# Patient Record
Sex: Female | Born: 1950
Health system: Southern US, Community
[De-identification: ages and names within clinical notes are randomized; demographics above are authoritative.]

## PROBLEM LIST (undated history)

## (undated) DIAGNOSIS — E079 Disorder of thyroid, unspecified: Secondary | ICD-10-CM

## (undated) HISTORY — PX: BACK SURGERY: SHX140

## (undated) HISTORY — DX: Disorder of thyroid, unspecified: E07.9

## (undated) HISTORY — PX: LAPAROSCOPIC BILATERAL SALPINGO OOPHERECTOMY: SHX5890

---

## 1998-01-02 ENCOUNTER — Emergency Department (HOSPITAL_COMMUNITY): Admission: EM | Admit: 1998-01-02 | Discharge: 1998-01-02 | Payer: Self-pay | Admitting: Emergency Medicine

## 1998-01-09 ENCOUNTER — Inpatient Hospital Stay (HOSPITAL_COMMUNITY): Admission: RE | Admit: 1998-01-09 | Discharge: 1998-01-10 | Payer: Self-pay | Admitting: Neurosurgery

## 1998-11-10 ENCOUNTER — Emergency Department (HOSPITAL_COMMUNITY): Admission: EM | Admit: 1998-11-10 | Discharge: 1998-11-10 | Payer: Self-pay | Admitting: Emergency Medicine

## 2002-07-09 ENCOUNTER — Emergency Department (HOSPITAL_COMMUNITY): Admission: EM | Admit: 2002-07-09 | Discharge: 2002-07-10 | Payer: Self-pay | Admitting: Emergency Medicine

## 2002-07-10 ENCOUNTER — Encounter: Payer: Self-pay | Admitting: Emergency Medicine

## 2006-07-25 ENCOUNTER — Ambulatory Visit: Payer: Self-pay

## 2007-05-02 ENCOUNTER — Ambulatory Visit: Payer: Self-pay

## 2009-08-19 ENCOUNTER — Encounter: Admission: RE | Admit: 2009-08-19 | Discharge: 2009-08-19 | Payer: Self-pay | Admitting: Endocrinology

## 2009-09-10 ENCOUNTER — Other Ambulatory Visit: Admission: RE | Admit: 2009-09-10 | Discharge: 2009-09-10 | Payer: Self-pay | Admitting: Interventional Radiology

## 2009-09-10 ENCOUNTER — Encounter: Admission: RE | Admit: 2009-09-10 | Discharge: 2009-09-10 | Payer: Self-pay | Admitting: Endocrinology

## 2010-08-11 ENCOUNTER — Other Ambulatory Visit: Payer: Self-pay | Admitting: Endocrinology

## 2010-08-11 DIAGNOSIS — E041 Nontoxic single thyroid nodule: Secondary | ICD-10-CM

## 2010-08-14 ENCOUNTER — Ambulatory Visit
Admission: RE | Admit: 2010-08-14 | Discharge: 2010-08-14 | Disposition: A | Discharge status: Home / Self Care | Payer: 59 | Source: Ambulatory Visit | Attending: Endocrinology | Admitting: Endocrinology

## 2010-08-14 DIAGNOSIS — E041 Nontoxic single thyroid nodule: Secondary | ICD-10-CM

## 2012-02-14 ENCOUNTER — Other Ambulatory Visit: Payer: Self-pay | Admitting: Otolaryngology

## 2012-02-14 DIAGNOSIS — R0982 Postnasal drip: Secondary | ICD-10-CM

## 2012-07-19 ENCOUNTER — Other Ambulatory Visit: Payer: Self-pay | Admitting: Endocrinology

## 2012-07-19 DIAGNOSIS — E049 Nontoxic goiter, unspecified: Secondary | ICD-10-CM

## 2012-07-28 ENCOUNTER — Ambulatory Visit
Admission: RE | Admit: 2012-07-28 | Discharge: 2012-07-28 | Disposition: A | Discharge status: Home / Self Care | Payer: 59 | Source: Ambulatory Visit | Attending: Endocrinology | Admitting: Endocrinology

## 2012-07-28 DIAGNOSIS — E049 Nontoxic goiter, unspecified: Secondary | ICD-10-CM

## 2013-10-25 ENCOUNTER — Other Ambulatory Visit: Payer: Self-pay | Admitting: Endocrinology

## 2013-10-25 DIAGNOSIS — E049 Nontoxic goiter, unspecified: Secondary | ICD-10-CM

## 2013-11-01 ENCOUNTER — Ambulatory Visit
Admission: RE | Admit: 2013-11-01 | Discharge: 2013-11-01 | Disposition: A | Discharge status: Home / Self Care | Payer: 59 | Source: Ambulatory Visit | Attending: Endocrinology | Admitting: Endocrinology

## 2013-11-01 DIAGNOSIS — E049 Nontoxic goiter, unspecified: Secondary | ICD-10-CM

## 2014-06-28 ENCOUNTER — Other Ambulatory Visit: Payer: Self-pay | Admitting: Obstetrics and Gynecology

## 2014-07-04 LAB — CYTOLOGY - PAP

## 2014-08-02 ENCOUNTER — Other Ambulatory Visit: Payer: Self-pay | Admitting: Obstetrics and Gynecology

## 2014-08-06 LAB — CYTOLOGY - PAP

## 2016-05-05 ENCOUNTER — Other Ambulatory Visit: Payer: Self-pay | Admitting: Obstetrics and Gynecology

## 2016-05-05 DIAGNOSIS — N644 Mastodynia: Secondary | ICD-10-CM

## 2016-05-11 ENCOUNTER — Other Ambulatory Visit: Payer: Self-pay

## 2016-05-12 ENCOUNTER — Ambulatory Visit
Admission: RE | Admit: 2016-05-12 | Discharge: 2016-05-12 | Disposition: A | Discharge status: Home / Self Care | Payer: Commercial Managed Care - HMO | Source: Ambulatory Visit | Attending: Obstetrics and Gynecology | Admitting: Obstetrics and Gynecology

## 2016-05-12 ENCOUNTER — Other Ambulatory Visit: Payer: Self-pay

## 2016-05-12 DIAGNOSIS — N644 Mastodynia: Secondary | ICD-10-CM

## 2016-11-29 ENCOUNTER — Telehealth: Payer: Self-pay | Admitting: *Deleted

## 2016-11-29 ENCOUNTER — Encounter: Payer: Self-pay | Admitting: *Deleted

## 2016-11-29 NOTE — Telephone Encounter (Signed)
Referral notes sent to scheduling

## 2017-02-02 ENCOUNTER — Other Ambulatory Visit: Payer: Self-pay | Admitting: Endocrinology

## 2017-02-02 DIAGNOSIS — E049 Nontoxic goiter, unspecified: Secondary | ICD-10-CM

## 2017-02-28 ENCOUNTER — Ambulatory Visit
Admission: RE | Admit: 2017-02-28 | Discharge: 2017-02-28 | Disposition: A | Discharge status: Home / Self Care | Payer: 59 | Source: Ambulatory Visit | Attending: Endocrinology | Admitting: Endocrinology

## 2017-02-28 DIAGNOSIS — E049 Nontoxic goiter, unspecified: Secondary | ICD-10-CM

## 2017-10-21 ENCOUNTER — Ambulatory Visit: Payer: 59 | Admitting: Cardiology

## 2017-10-25 NOTE — Progress Notes (Signed)
Cardiology Office Note:    Date:  10/26/2017   ID:  Monica Camacho, DOB [birth year], MRN [id number]  PCP:  Leighton Ruff, MD  Cardiologist:  Buford Dresser, MD PhD  Referring MD: Leighton Ruff, MD   Chief Complaint  Patient presents with  . Follow-up  . Chest Pain    Atypical.  . Shortness of Breath    Going up stairs.  . Headache   History of Present Illness:    Monica Camacho is a 67 y.o. female with a hx of hypothyroidism who is seen as a new consult at the request of Leighton Ruff, MD for the evaluation and management of chest pain, family history of CAD.  Per records received from Dr. Drema Dallas' office: having a "sting" in her left breast, occurs randomly, burning/stinging sensation, lasts less than a minute. No associated nausea, vomiting, or shortness of breath. She is physically active, and pain does not occur with activity. ECG in the office is reported as sinus rhythm, 53 bpm.   Patient concerns: chest pain, risk based on her history  Chest pain:  -Initial onset: about a year ago -Quality: burning, stinging -Frequency: weekly -Duration: very brief usually, longest ever was maybe an hour -Triggers: stress, not related to exertion -Aggravating/alleviating factors: better with aspirin. Not related to exertion, time of day, food, or position. Not pleuritic, no tenderness -Prior cardiac history: no personal -Prior ECG: normal sinus rhythm -Prior workup: none -Prior treatment: none -Alcohol: none -Tobacco: former smoker, smoked for about 5-6 years, quit in 1978 or so -Comorbidities: hypothyroidism, hyperlipidemia -Exercise level: walks all day for her job, works around the house and in her yard -Labs: TSH normal. -Cardiac ROS: no shortness of breath, no PND, no orthopnea, no LE edema, no syncope  Family history: brother died of MI at age 39, had HTN and HLD; younger sister has stents; father had CVAs, died at age 29; mother had heart failure for 15  years and a history of angina, passed away at age 56.Marland Kitchen  Social history: Prior smoker, no alcohol. Divorced, has two children. Raising her granddaughter. Stays active and also walks as a part of her job. Diet is poor.  Past Medical History:  Diagnosis Date  . Thyroid disease     Past Surgical History:  Procedure Laterality Date  . BACK SURGERY    . CESAREAN SECTION    . LAPAROSCOPIC BILATERAL SALPINGO OOPHERECTOMY      Current Medications: Current Outpatient Medications on File Prior to Visit  Medication Sig  . levothyroxine (SYNTHROID, LEVOTHROID) 112 MCG tablet Take 112 mcg by mouth daily.  . Vitamin D, Ergocalciferol, 2000 units CAPS Take 2,000 Units by mouth daily.   No current facility-administered medications on file prior to visit.      Allergies:   Penicillins   Social History   Socioeconomic History  . Marital status: Single    Spouse name: Not on file  . Number of children: Not on file  . Years of education: Not on file  . Highest education level: Not on file  Occupational History  . Not on file  Social Needs  . Financial resource strain: Not on file  . Food insecurity:    Worry: Not on file    Inability: Not on file  . Transportation needs:    Medical: Not on file    Non-medical: Not on file  Tobacco Use  . Smoking status: Former Research scientist (life sciences)  . Smokeless tobacco: Never Used  Substance and Sexual Activity  .  Alcohol use: No  . Drug use: No  . Sexual activity: Not on file  Lifestyle  . Physical activity:    Days per week: Not on file    Minutes per session: Not on file  . Stress: Not on file  Relationships  . Social connections:    Talks on phone: Not on file    Gets together: Not on file    Attends religious service: Not on file    Active member of club or organization: Not on file    Attends meetings of clubs or organizations: Not on file    Relationship status: Not on file  Other Topics Concern  . Not on file  Social History Narrative  . Not on  file     Family History: The patient's family history includes Heart attack (age of onset: 54) in her brother; Heart disease in her father, mother, and sister; Heart failure in her mother; Stroke in her father.  ROS:   Please see the history of present illness.  Additional pertinent ROS: Review of Systems  Constitutional: Negative for chills and fever.  HENT: Negative for ear pain and hearing loss.   Eyes: Negative for photophobia and pain.  Respiratory: Positive for shortness of breath. Negative for sputum production.   Cardiovascular: Positive for chest pain. Negative for palpitations, orthopnea, claudication, leg swelling and PND.  Gastrointestinal: Negative for abdominal pain, blood in stool and melena.  Genitourinary: Negative for dysuria and hematuria.  Musculoskeletal: Negative for falls and myalgias.  Skin: Negative for itching and rash.  Neurological: Negative for sensory change, loss of consciousness and weakness.  Endo/Heme/Allergies: Does not bruise/bleed easily.   EKGs/Labs/Other Studies Reviewed:    The following studies were reviewed today: Prior notes and labs  EKG:  EKG is ordered today.  The ekg ordered today demonstrates normal sinus rhythm  Recent Labs: No results found for requested labs within last 8760 hours.  Recent Lipid Panel No results found for: CHOL, TRIG, HDL, CHOLHDL, VLDL, LDLCALC, LDLDIRECT  Lipids 11/29/2016 Tchol 205, LDL 142, HDL 50 TSH 0.658 No A1c  Physical Exam:    VS:  BP 108/72 (BP Location: Left Arm, Patient Position: Sitting, Cuff Size: Normal)   Pulse 72   Ht 5\' 2"  (1.575 m)   Wt 141 lb (64 kg)   BMI 25.79 kg/m     Wt Readings from Last 3 Encounters:  10/26/17 141 lb (64 kg)     GEN: Well nourished, well developed in no acute distress HEENT: Normal NECK: No JVD; No carotid bruits LYMPHATICS: No lymphadenopathy CARDIAC: regular rhythm, normal S1 and S2, no murmurs, rubs, gallops. Radial and DP pulses 2+  bilaterally. RESPIRATORY:  Clear to auscultation without rales, wheezing or rhonchi  ABDOMEN: Soft, non-tender, non-distended MUSCULOSKELETAL:  No edema; No deformity  SKIN: Warm and dry NEUROLOGIC:  Alert and oriented x 3 PSYCHIATRIC:  Normal affect   ASSESSMENT:    1. Chest pain, unspecified type   2. Pre-procedure lab exam   3. Counseling on health promotion and disease prevention   4. Family history of heart disease    PLAN:    1. Chest pain: symptoms atypical, but has been very concerning for her. She has a strong family history, which is not accounted for in her ASCVD risk score. It would be beneficial to know her anatomy, both to determine if CAD is the cause of her pain, and a coronary calcium score/plaque quantification would be helpful for risk stratification and management  -  CT coronary angiography, with BMP prior to test  -will also order future lipid panel (to be drawn at the same time) to update her risk assessment  2. Primary prevention 10 year ASCDV risk is 4.2%. However, her family history is not part of this. Based on results of testing, would discuss further if she would benefit from a statin  Prevention: -recommend heart healthy/Mediterranean diet, with whole grains, fruits, vegetable, fish, lean meats, nuts, and olive oil. Limit salt. -recommend moderate walking, 3-5 times/week for 30-50 minutes each session. Aim for at least 150 minutes.week. Goal should be pace of 3 miles/hours, or walking 1.5 miles in 30 minutes -recommend avoidance of tobacco products. Avoid excess alcohol. -Additional risk factor control:  -Diabetes: A1c is not in the system  -Lipids: updating as above  -Blood pressure control: well controlled  -Weight: BMI 25  Plan for follow up: 3 mos to discuss risk assessment and next steps  Medication Adjustments/Labs and Tests Ordered: Current medicines are reviewed at length with the patient today.  Concerns regarding medicines are outlined above.   Orders Placed This Encounter  Procedures  . CT CORONARY MORPH W/CTA COR W/SCORE W/CA W/CM &/OR WO/CM  . CT CORONARY FRACTIONAL FLOW RESERVE DATA PREP  . CT CORONARY FRACTIONAL FLOW RESERVE FLUID ANALYSIS  . Basic metabolic panel  . EKG 12-Lead   Meds ordered this encounter  Medications  . metoprolol tartrate (LOPRESSOR) 100 MG tablet    Sig: TAKE 1 TABLET 2 HOURS PRIOR TO TEST    Dispense:  1 tablet    Refill:  0    Patient Instructions  Medication Instructions:  TAKE METOPROLOL 100 MG 1 TABLET 2 HOURS PRIOR TO TEST   Labwork: BMET 1 WEEK PRIOR TO CT SCAN   Testing/Procedures: Your physician has requested that you have cardiac CT. Cardiac computed tomography (CT) is a painless test that uses an x-ray machine to take clear, detailed pictures of your heart. For further information please visit [email or web address]. Please follow instruction sheet as given. THE OFFICE WILL CALL TO SCHEDULE ONCE THIS HAS BEEN APPROVED BY YOUR INSURANCE   Follow-Up: Your physician recommends that you schedule a follow-up appointment in: 3 MONTHS   Please arrive at the [facility] main entrance of [facility] at xx:xx AM (30-45 minutes prior to test start time)  [facility] [place], [place] [id number] [phone]  Proceed to the [facility] Radiology Department (First Floor).  Please follow these instructions carefully (unless otherwise directed):  On the Night Before the Test: . Be sure to Drink plenty of water. . Do not consume any caffeinated/decaffeinated beverages or chocolate 12 hours prior to your test. . Do not take any antihistamines 12 hours prior to your test. . If you take Metformin do not take 24 hours prior to test. . If the patient has contrast allergy: ? Patient will need a prescription for Prednisone and very clear instructions (as follows): 1. Prednisone 50 mg - take 13 hours prior to test 2. Take another Prednisone 50 mg 7 hours prior  to test 3. Take another Prednisone 50 mg 1 hour prior to test 4. Take Benadryl 50 mg 1 hour prior to test . Patient must complete all four doses of above prophylactic medications. . Patient will need a ride after test due to Benadryl.  On the Day of the Test: . Drink plenty of water. Do not drink any water within one hour of the test. . Do not eat  any food 4 hours prior to the test. . You may take your regular medications prior to the test. . IF NOT ON BETA BLOCKER-Take 100 mg of Lopressor (metoprolol) TWO hours before test . HOLD Furosemide morning of the test.  After the Test: . Drink plenty of water. . After receiving IV contrast, you may experience a mild flushed feeling. This is normal. . On occasion, you may experience a mild rash up to 24 hours after the test. This is not dangerous. If this occurs, you can take Benadryl 25 mg and increase your fluid intake. . If you experience trouble breathing, this can be serious. If it is severe call 911 IMMEDIATELY. If it is mild, please call our office. . If you take any of these medications: Glipizide/Metformin, Avandament, Glucavance, please do not take 48 hours after completing test     Cardiac CT Angiogram A cardiac CT angiogram is a procedure to look at the heart and the area around the heart. It may be done to help find the cause of chest pains or other symptoms of heart disease. During this procedure, a large X-ray machine, called a CT scanner, takes detailed pictures of the heart and the surrounding area after a dye (contrast material) has been injected into blood vessels in the area. The procedure is also sometimes called a coronary CT angiogram, coronary artery scanning, or CTA. A cardiac CT angiogram allows the health care provider to see how well blood is flowing to and from the heart. The health care provider will be able to see if there are any problems, such as:  Blockage or narrowing of the coronary arteries in the  heart.  Fluid around the heart.  Signs of weakness or disease in the muscles, valves, and tissues of the heart.  Tell a health care provider about:  Any allergies you have. This is especially important if you have had a previous allergic reaction to contrast dye.  All medicines you are taking, including vitamins, herbs, eye drops, creams, and over-the-counter medicines.  Any blood disorders you have.  Any surgeries you have had.  Any medical conditions you have.  Whether you are pregnant or may be pregnant.  Any anxiety disorders, chronic pain, or other conditions you have that may increase your stress or prevent you from lying still. What are the risks? Generally, this is a safe procedure. However, problems may occur, including:  Bleeding.  Infection.  Allergic reactions to medicines or dyes.  Damage to other structures or organs.  Kidney damage from the dye or contrast that is used.  Increased risk of cancer from radiation exposure. This risk is low. Talk with your health care provider about: ? The risks and benefits of testing. ? How you can receive the lowest dose of radiation.  What happens before the procedure?  Wear comfortable clothing and remove any jewelry, glasses, dentures, and hearing aids.  Follow instructions from your health care provider about eating and drinking. This may include: ? For 12 hours before the test - avoid caffeine. This includes tea, coffee, soda, energy drinks, and diet pills. Drink plenty of water or other fluids that do not have caffeine in them. Being well-hydrated can prevent complications. ? For 4-6 hours before the test - stop eating and drinking. The contrast dye can cause nausea, but this is less likely if your stomach is empty.  Ask your health care provider about changing or stopping your regular medicines. This is especially important if you are taking diabetes  medicines, blood thinners, or medicines to treat erectile  dysfunction. What happens during the procedure?  Hair on your chest may need to be removed so that small sticky patches called electrodes can be placed on your chest. These will transmit information that helps to monitor your heart during the test.  An IV tube will be inserted into one of your veins.  You might be given a medicine to control your heart rate during the test. This will help to ensure that good images are obtained.  You will be asked to lie on an exam table. This table will slide in and out of the CT machine during the procedure.  Contrast dye will be injected into the IV tube. You might feel warm, or you may get a metallic taste in your mouth.  You will be given a medicine (nitroglycerin) to relax (dilate) the arteries in your heart.  The table that you are lying on will move into the CT machine tunnel for the scan.  The person running the machine will give you instructions while the scans are being done. You may be asked to: ? Keep your arms above your head. ? Hold your breath. ? Stay very still, even if the table is moving.  When the scanning is complete, you will be moved out of the machine.  The IV tube will be removed. The procedure may vary among health care providers and hospitals. What happens after the procedure?  You might feel warm, or you may get a metallic taste in your mouth from the contrast dye.  You may have a headache from the nitroglycerin.  After the procedure, drink water or other fluids to wash (flush) the contrast material out of your body.  Contact a health care provider if you have any symptoms of allergy to the contrast. These symptoms include: ? Shortness of breath. ? Rash or hives. ? A racing heartbeat.  Most people can return to their normal activities right after the procedure. Ask your health care provider what activities are safe for you.  It is up to you to get the results of your procedure. Ask your health care provider, or the  department that is doing the procedure, when your results will be ready. Summary  A cardiac CT angiogram is a procedure to look at the heart and the area around the heart. It may be done to help find the cause of chest pains or other symptoms of heart disease.  During this procedure, a large X-ray machine, called a CT scanner, takes detailed pictures of the heart and the surrounding area after a dye (contrast material) has been injected into blood vessels in the area.  Ask your health care provider about changing or stopping your regular medicines before the procedure. This is especially important if you are taking diabetes medicines, blood thinners, or medicines to treat erectile dysfunction.  After the procedure, drink water or other fluids to wash (flush) the contrast material out of your body. This information is not intended to replace advice given to you by your health care provider. Make sure you discuss any questions you have with your health care provider. Document Released: 01/01/2008 Document Revised: 12/08/2015 Document Reviewed: 12/08/2015 Elsevier Interactive Patient Education  2017 [employer]      Signed, Buford Dresser, MD PhD 10/26/2017 1:11 PM    [place]

## 2017-10-26 ENCOUNTER — Ambulatory Visit: Payer: 59 | Admitting: Cardiology

## 2017-10-26 ENCOUNTER — Encounter: Payer: Self-pay | Admitting: Cardiology

## 2017-10-26 VITALS — BP 108/72 | HR 72 | Ht 62.0 in | Wt 141.0 lb

## 2017-10-26 DIAGNOSIS — Z7189 Other specified counseling: Secondary | ICD-10-CM

## 2017-10-26 DIAGNOSIS — R079 Chest pain, unspecified: Secondary | ICD-10-CM | POA: Diagnosis not present

## 2017-10-26 DIAGNOSIS — Z01812 Encounter for preprocedural laboratory examination: Secondary | ICD-10-CM

## 2017-10-26 DIAGNOSIS — Z8249 Family history of ischemic heart disease and other diseases of the circulatory system: Secondary | ICD-10-CM | POA: Diagnosis not present

## 2017-10-26 MED ORDER — METOPROLOL TARTRATE 100 MG PO TABS: ORAL_TABLET | ORAL | 0 refills | Status: DC

## 2017-10-26 NOTE — Patient Instructions (Addendum)
Medication Instructions:  TAKE METOPROLOL 100 MG 1 TABLET 2 HOURS PRIOR TO TEST   Labwork: BMET 1 WEEK PRIOR TO CT SCAN   Testing/Procedures: Your physician has requested that you have cardiac CT. Cardiac computed tomography (CT) is a painless test that uses an x-ray machine to take clear, detailed pictures of your heart. For further information please visit [email or web address]. Please follow instruction sheet as given. THE OFFICE WILL CALL TO SCHEDULE ONCE THIS HAS BEEN APPROVED BY YOUR INSURANCE   Follow-Up: Your physician recommends that you schedule a follow-up appointment in: 3 MONTHS   Please arrive at the [facility] main entrance of [facility] at xx:xx AM (30-45 minutes prior to test start time)  [facility] [place], [place] [phone]  Proceed to the [facility] Radiology Department (First Floor).  Please follow these instructions carefully (unless otherwise directed):  On the Night Before the Test: . Be sure to Drink plenty of water. . Do not consume any caffeinated/decaffeinated beverages or chocolate 12 hours prior to your test. . Do not take any antihistamines 12 hours prior to your test. . If you take Metformin do not take 24 hours prior to test. . If the patient has contrast allergy: ? Patient will need a prescription for Prednisone and very clear instructions (as follows): 1. Prednisone 50 mg - take 13 hours prior to test 2. Take another Prednisone 50 mg 7 hours prior to test 3. Take another Prednisone 50 mg 1 hour prior to test 4. Take Benadryl 50 mg 1 hour prior to test . Patient must complete all four doses of above prophylactic medications. . Patient will need a ride after test due to Benadryl.  On the Day of the Test: . Drink plenty of water. Do not drink any water within one hour of the test. . Do not eat any food 4 hours prior to the test. . You may take your regular medications prior to the test. . IF  NOT ON BETA BLOCKER-Take 100 mg of Lopressor (metoprolol) TWO hours before test . HOLD Furosemide morning of the test.  After the Test: . Drink plenty of water. . After receiving IV contrast, you may experience a mild flushed feeling. This is normal. . On occasion, you may experience a mild rash up to 24 hours after the test. This is not dangerous. If this occurs, you can take Benadryl 25 mg and increase your fluid intake. . If you experience trouble breathing, this can be serious. If it is severe call 911 IMMEDIATELY. If it is mild, please call our office. . If you take any of these medications: Glipizide/Metformin, Avandament, Glucavance, please do not take 48 hours after completing test     Cardiac CT Angiogram A cardiac CT angiogram is a procedure to look at the heart and the area around the heart. It may be done to help find the cause of chest pains or other symptoms of heart disease. During this procedure, a large X-ray machine, called a CT scanner, takes detailed pictures of the heart and the surrounding area after a dye (contrast material) has been injected into blood vessels in the area. The procedure is also sometimes called a coronary CT angiogram, coronary artery scanning, or CTA. A cardiac CT angiogram allows the health care provider to see how well blood is flowing to and from the heart. The health care provider will be able to see if there are any problems, such as:  Blockage or  narrowing of the coronary arteries in the heart.  Fluid around the heart.  Signs of weakness or disease in the muscles, valves, and tissues of the heart.  Tell a health care provider about:  Any allergies you have. This is especially important if you have had a previous allergic reaction to contrast dye.  All medicines you are taking, including vitamins, herbs, eye drops, creams, and over-the-counter medicines.  Any blood disorders you have.  Any surgeries you have had.  Any medical conditions  you have.  Whether you are pregnant or may be pregnant.  Any anxiety disorders, chronic pain, or other conditions you have that may increase your stress or prevent you from lying still. What are the risks? Generally, this is a safe procedure. However, problems may occur, including:  Bleeding.  Infection.  Allergic reactions to medicines or dyes.  Damage to other structures or organs.  Kidney damage from the dye or contrast that is used.  Increased risk of cancer from radiation exposure. This risk is low. Talk with your health care provider about: ? The risks and benefits of testing. ? How you can receive the lowest dose of radiation.  What happens before the procedure?  Wear comfortable clothing and remove any jewelry, glasses, dentures, and hearing aids.  Follow instructions from your health care provider about eating and drinking. This may include: ? For 12 hours before the test - avoid caffeine. This includes tea, coffee, soda, energy drinks, and diet pills. Drink plenty of water or other fluids that do not have caffeine in them. Being well-hydrated can prevent complications. ? For 4-6 hours before the test - stop eating and drinking. The contrast dye can cause nausea, but this is less likely if your stomach is empty.  Ask your health care provider about changing or stopping your regular medicines. This is especially important if you are taking diabetes medicines, blood thinners, or medicines to treat erectile dysfunction. What happens during the procedure?  Hair on your chest may need to be removed so that small sticky patches called electrodes can be placed on your chest. These will transmit information that helps to monitor your heart during the test.  An IV tube will be inserted into one of your veins.  You might be given a medicine to control your heart rate during the test. This will help to ensure that good images are obtained.  You will be asked to lie on an exam  table. This table will slide in and out of the CT machine during the procedure.  Contrast dye will be injected into the IV tube. You might feel warm, or you may get a metallic taste in your mouth.  You will be given a medicine (nitroglycerin) to relax (dilate) the arteries in your heart.  The table that you are lying on will move into the CT machine tunnel for the scan.  The person running the machine will give you instructions while the scans are being done. You may be asked to: ? Keep your arms above your head. ? Hold your breath. ? Stay very still, even if the table is moving.  When the scanning is complete, you will be moved out of the machine.  The IV tube will be removed. The procedure may vary among health care providers and hospitals. What happens after the procedure?  You might feel warm, or you may get a metallic taste in your mouth from the contrast dye.  You may have a headache from the nitroglycerin.  After the procedure, drink water or other fluids to wash (flush) the contrast material out of your body.  Contact a health care provider if you have any symptoms of allergy to the contrast. These symptoms include: ? Shortness of breath. ? Rash or hives. ? A racing heartbeat.  Most people can return to their normal activities right after the procedure. Ask your health care provider what activities are safe for you.  It is up to you to get the results of your procedure. Ask your health care provider, or the department that is doing the procedure, when your results will be ready. Summary  A cardiac CT angiogram is a procedure to look at the heart and the area around the heart. It may be done to help find the cause of chest pains or other symptoms of heart disease.  During this procedure, a large X-ray machine, called a CT scanner, takes detailed pictures of the heart and the surrounding area after a dye (contrast material) has been injected into blood vessels in the  area.  Ask your health care provider about changing or stopping your regular medicines before the procedure. This is especially important if you are taking diabetes medicines, blood thinners, or medicines to treat erectile dysfunction.  After the procedure, drink water or other fluids to wash (flush) the contrast material out of your body. This information is not intended to replace advice given to you by your health care provider. Make sure you discuss any questions you have with your health care provider. Document Released: 01/01/2008 Document Revised: 12/08/2015 Document Reviewed: 12/08/2015 Elsevier Interactive Patient Education  2017 [employer].

## 2017-12-23 LAB — BASIC METABOLIC PANEL
BUN / CREAT RATIO: 14 (ref 12–28)
BUN: 16 mg/dL (ref 8–27)
CO2: 22 mmol/L (ref 20–29)
CREATININE: 1.14 mg/dL — AB (ref 0.57–1.00)
Calcium: 9.1 mg/dL (ref 8.7–10.3)
Chloride: 107 mmol/L — ABNORMAL HIGH (ref 96–106)
GFR calc Af Amer: 58 mL/min/{1.73_m2} — ABNORMAL LOW (ref >59)
GFR, EST NON AFRICAN AMERICAN: 51 mL/min/{1.73_m2} — AB (ref >59)
GLUCOSE: 81 mg/dL (ref 65–99)
Potassium: 4.4 mmol/L (ref 3.5–5.2)
SODIUM: 144 mmol/L (ref 134–144)

## 2017-12-30 ENCOUNTER — Ambulatory Visit (HOSPITAL_COMMUNITY)
Admission: RE | Admit: 2017-12-30 | Discharge: 2017-12-30 | Disposition: A | Discharge status: Home / Self Care | Payer: 59 | Source: Ambulatory Visit | Attending: Cardiology | Admitting: Cardiology

## 2017-12-30 DIAGNOSIS — R079 Chest pain, unspecified: Secondary | ICD-10-CM | POA: Insufficient documentation

## 2017-12-30 MED ORDER — NITROGLYCERIN 0.4 MG SL SUBL
0.8000 mg | SUBLINGUAL_TABLET | Freq: Once | SUBLINGUAL | Status: AC
  Administered 2017-12-30: 0.8 mg via SUBLINGUAL
  Filled 2017-12-30: qty 25

## 2017-12-30 MED ORDER — NITROGLYCERIN 0.4 MG SL SUBL
SUBLINGUAL_TABLET | SUBLINGUAL | Status: AC
  Filled 2017-12-30: qty 2

## 2017-12-30 MED ORDER — IOPAMIDOL (ISOVUE-370) INJECTION 76%
100.0000 mL | Freq: Once | INTRAVENOUS | Status: AC | PRN
  Administered 2017-12-30: 80 mL via INTRAVENOUS

## 2018-01-04 ENCOUNTER — Other Ambulatory Visit: Payer: Self-pay

## 2018-01-04 DIAGNOSIS — Z79899 Other long term (current) drug therapy: Secondary | ICD-10-CM

## 2018-01-09 ENCOUNTER — Other Ambulatory Visit: Payer: Self-pay | Admitting: *Deleted

## 2018-01-09 DIAGNOSIS — Z79899 Other long term (current) drug therapy: Secondary | ICD-10-CM

## 2018-01-09 DIAGNOSIS — R079 Chest pain, unspecified: Secondary | ICD-10-CM

## 2018-01-09 DIAGNOSIS — Z8249 Family history of ischemic heart disease and other diseases of the circulatory system: Secondary | ICD-10-CM

## 2018-01-09 LAB — LIPID PANEL
CHOLESTEROL TOTAL: 203 mg/dL — AB (ref 100–199)
Chol/HDL Ratio: 3.6 ratio (ref 0.0–4.4)
HDL: 56 mg/dL (ref >39)
LDL CALC: 133 mg/dL — AB (ref 0–99)
Triglycerides: 70 mg/dL (ref 0–149)
VLDL CHOLESTEROL CAL: 14 mg/dL (ref 5–40)

## 2018-01-09 LAB — HEPATIC FUNCTION PANEL
ALT: 14 IU/L (ref 0–32)
AST: 18 IU/L (ref 0–40)
Albumin: 4.4 g/dL (ref 3.6–4.8)
Alkaline Phosphatase: 65 IU/L (ref 39–117)
BILIRUBIN TOTAL: 0.3 mg/dL (ref 0.0–1.2)
BILIRUBIN, DIRECT: 0.07 mg/dL (ref 0.00–0.40)
TOTAL PROTEIN: 6 g/dL (ref 6.0–8.5)

## 2018-01-12 ENCOUNTER — Ambulatory Visit: Payer: 59 | Admitting: Cardiology

## 2018-02-07 ENCOUNTER — Ambulatory Visit: Payer: 59 | Admitting: Cardiology

## 2018-02-07 ENCOUNTER — Encounter: Payer: Self-pay | Admitting: Cardiology

## 2018-02-07 VITALS — BP 132/74 | HR 69 | Ht 61.0 in | Wt 142.0 lb

## 2018-02-07 DIAGNOSIS — Z7189 Other specified counseling: Secondary | ICD-10-CM | POA: Diagnosis not present

## 2018-02-07 DIAGNOSIS — I2583 Coronary atherosclerosis due to lipid rich plaque: Secondary | ICD-10-CM

## 2018-02-07 DIAGNOSIS — I2510 Atherosclerotic heart disease of native coronary artery without angina pectoris: Secondary | ICD-10-CM | POA: Diagnosis not present

## 2018-02-07 DIAGNOSIS — Z8249 Family history of ischemic heart disease and other diseases of the circulatory system: Secondary | ICD-10-CM | POA: Diagnosis not present

## 2018-02-07 DIAGNOSIS — Z79899 Other long term (current) drug therapy: Secondary | ICD-10-CM | POA: Diagnosis not present

## 2018-02-07 DIAGNOSIS — R072 Precordial pain: Secondary | ICD-10-CM

## 2018-02-07 DIAGNOSIS — Z713 Dietary counseling and surveillance: Secondary | ICD-10-CM

## 2018-02-07 MED ORDER — ROSUVASTATIN CALCIUM 20 MG PO TABS: 20.0000 mg | ORAL_TABLET | Freq: Every day | ORAL | 3 refills | Status: DC

## 2018-02-07 NOTE — Progress Notes (Signed)
Cardiology Office Note:    Date:  02/07/2018   ID:  Monica Camacho, DOB 1950/11/30, MRN [id number]  PCP:  Leighton Ruff, MD  Cardiologist:  Buford Dresser, MD PhD  Referring MD: Leighton Ruff, MD   CC: Follow up of test results  History of Present Illness:    Monica Camacho is a 68 y.o. female with a hx of hypothyroidism who is seen in follow up for the evaluation and management of chest pain, family history of CAD. Initial consult was on 10/26/17.  Cardiac history: initially presented with a "sting" in her left breast, occurs randomly, burning/stinging sensation, lasts less than a minute. No associated nausea, vomiting, or shortness of breath. Nonexertional. She is physically active, and pain does not occur with activity. ECG in the office is reported as sinus rhythm, 53 bpm. She does however have a significant family history of CAD: brother died of MI at age 30, had HTN and HLD; younger sister has stents; father had CVAs, died at age 15; mother had heart failure for 15 years and a history of angina, passed away at age 28.  Today: spent time reviewing the results of her coronary CTA, including drawing out anatomy, reviewing recommendations for management. FFR was negative, so no indication for cath, and her chest pain is atypical. However, her risk is elevated, and we reviewed aggressive secondary prevention, see below.  Past Medical History:  Diagnosis Date  . Thyroid disease     Past Surgical History:  Procedure Laterality Date  . BACK SURGERY    . CESAREAN SECTION    . LAPAROSCOPIC BILATERAL SALPINGO OOPHERECTOMY      Current Medications: Current Outpatient Medications on File Prior to Visit  Medication Sig  . levothyroxine (SYNTHROID, LEVOTHROID) 112 MCG tablet Take 112 mcg by mouth daily.  . Vitamin D, Ergocalciferol, 2000 units CAPS Take 2,000 Units by mouth daily.   No current facility-administered medications on file prior to visit.      Allergies:    Penicillins   Social History   Socioeconomic History  . Marital status: Single    Spouse name: Not on file  . Number of children: Not on file  . Years of education: Not on file  . Highest education level: Not on file  Occupational History  . Not on file  Social Needs  . Financial resource strain: Not on file  . Food insecurity:    Worry: Not on file    Inability: Not on file  . Transportation needs:    Medical: Not on file    Non-medical: Not on file  Tobacco Use  . Smoking status: Former Research scientist (life sciences)  . Smokeless tobacco: Never Used  Substance and Sexual Activity  . Alcohol use: No  . Drug use: No  . Sexual activity: Not on file  Lifestyle  . Physical activity:    Days per week: Not on file    Minutes per session: Not on file  . Stress: Not on file  Relationships  . Social connections:    Talks on phone: Not on file    Gets together: Not on file    Attends religious service: Not on file    Active member of club or organization: Not on file    Attends meetings of clubs or organizations: Not on file    Relationship status: Not on file  Other Topics Concern  . Not on file  Social History Narrative  . Not on file   Social history: Prior smoker,  no alcohol. Divorced, has two children. Raising her granddaughter. Stays active and also walks as a part of her job. Diet is poor.  Family History: The patient's family history includes Heart attack (age of onset: 33) in her brother; Heart disease in her father, mother, and sister; Heart failure in her mother; Stroke in her father.  as sinus rhythm, 53 bpm. She does however have a significant family history of CAD, y history: brother died of MI at age 32, had HTN and HLD; younger sister has stents; father had CVAs, died at age 31; mother had heart failure for 15 years and a history of angina, passed away at age 76  ROS:   Please see the history of present illness.  Additional pertinent ROS: Review of Systems  Constitutional: Negative  for chills and fever.  HENT: Negative for ear pain and hearing loss.   Eyes: Negative for photophobia and pain.  Respiratory: Negative for sputum production and shortness of breath.   Cardiovascular: Positive for chest pain. Negative for palpitations, orthopnea, claudication, leg swelling and PND.  Gastrointestinal: Negative for abdominal pain, blood in stool and melena.  Genitourinary: Negative for dysuria and hematuria.  Musculoskeletal: Negative for falls and myalgias.  Skin: Negative for itching and rash.  Neurological: Negative for sensory change, loss of consciousness and weakness.  Endo/Heme/Allergies: Does not bruise/bleed easily.   EKGs/Labs/Other Studies Reviewed:    The following studies were reviewed today: Coronary CTA with FFR 12/31/17  Coronary Arteries:  Normal coronary origin.  Right dominance.  RCA is a large dominant artery that gives rise to PDA and PLVB. There is minimal plaque.  Left main is a large artery that gives rise to LAD a very small ramus intermedius and LCX arteries. Left main has minimal plaque in the distal portion with stenosis < 0-25%.  LAD is a large vessel that gives rise to a large diagonal artery. Proximal LAD has a long segment of mixed plaque with a focal stenosis 50-69%. Mid to distal LAD has minimal stenosis.  D1 is a large artery that has moderate mixed plaque in the proximal segment with a focal stenosis of 50-69%.  LCX is a non-dominant artery that gives rise to one medium sized OM1 branch. There is mild diffuse plaque.  IMPRESSION: 1. Coronary calcium score of 153. This was 58 percentile for age and sex matched control.  2. Normal coronary origin with right dominance.  3. There is moderate plaque in the proximal LAD and proximal to mid portion of a large 1. diagonal artery  FFR: 1. Left Main:  No significant stenosis. 2. LAD: No significant stenosis. 3. LCX: No significant stenosis. 4. RCA: No significant  stenosis.   EKG:  EKG is personally reviewed today.  The ekg ordered previously demonstrates normal sinus rhythm  Recent Labs: 12/22/2017: BUN 16; Creatinine, Ser 1.14; Potassium 4.4; Sodium 144 01/09/2018: ALT 14  Recent Lipid Panel    Component Value Date/Time   CHOL 203 (H) 01/09/2018 0912   TRIG 70 01/09/2018 0912   HDL 56 01/09/2018 0912   CHOLHDL 3.6 01/09/2018 0912   LDLCALC 133 (H) 01/09/2018 0912    Lipids 11/29/2016 Tchol 205, LDL 142, HDL 50 TSH 0.658 No A1c  Physical Exam:    VS:  BP 132/74   Pulse 69   Ht 5\' 1"  (1.549 m)   Wt 142 lb (64.4 kg)   BMI 26.83 kg/m     Wt Readings from Last 3 Encounters:  02/07/18 142 lb (64.4 kg)  10/26/17 141 lb (64 kg)     GEN: Well nourished, well developed in no acute distress HEENT: Normal NECK: No JVD; No carotid bruits LYMPHATICS: No lymphadenopathy CARDIAC: regular rhythm, normal S1 and S2, no murmurs, rubs, gallops. Radial and DP pulses 2+ bilaterally. RESPIRATORY:  Clear to auscultation without rales, wheezing or rhonchi  ABDOMEN: Soft, non-tender, non-distended MUSCULOSKELETAL:  No edema; No deformity  SKIN: Warm and dry NEUROLOGIC:  Alert and oriented x 3 PSYCHIATRIC:  Normal affect   ASSESSMENT:    1. Coronary artery disease due to lipid rich plaque   2. Medication management   3. Family history of heart disease   4. Counseling on health promotion and disease prevention   5. Nutritional counseling   6. Precordial pain    PLAN:    1. Chest pain: symptoms atypical, nonobstructive CAD seen on imaging. Suspect noncardiac source. Counseled that she is at risk for MI and any severe chest pain should warrant emergent evaluation.  2. Coronary artery disease based on CT coronary imaging: -reviewed test results at length today.  -her ASCVD risk score prior to the test was 4.2%, but she has significant calcium and plaque noted on her imaging. Her risk is therefore not accurately predicted based on the risk  calculator, likely due to impact from her family history -we discussed management of CAD. We discussed risks and benefits of statins. She would prefer to try rosuvastatin over atorvastatin. We will start with a high-intensity dose of rosuvastatin, 20 mg -we discussed the recommendation for low dose aspirin, including using a shared decision making tool, as she is very concerned about the risk of bleeding. She wishes to start one medication at a time and prefers to start with rosuvastatin. We will re-discuss aspirin at follow up.   3. Secondary prevention -recommend heart healthy/Mediterranean diet, with whole grains, fruits, vegetable, fish, lean meats, nuts, and olive oil. Limit salt.  -recommend moderate walking, 3-5 times/week for 30-50 minutes each session. Aim for at least 150 minutes.week. Goal should be pace of 3 miles/hours, or walking 1.5 miles in 30 minutes -recommend avoidance of tobacco products. Avoid excess alcohol. -no history of diabetes -unclear wha  Plan for follow up: 3 mos, check lipids/LFTs, re-discuss aspirin  TIME SPENT WITH PATIENT: >40 minutes of direct patient care. More than 50% of that time was spent on coordination of care and counseling regarding CAD, test results, and secondary prevention.  Buford Dresser, MD, PhD [place]  [facility]   Medication Adjustments/Labs and Tests Ordered: Current medicines are reviewed at length with the patient today.  Concerns regarding medicines are outlined above.  Orders Placed This Encounter  Procedures  . Lipid panel  . Hepatic function panel   Meds ordered this encounter  Medications  . rosuvastatin (CRESTOR) 20 MG tablet    Sig: Take 1 tablet (20 mg total) by mouth daily at 6 PM.    Dispense:  90 tablet    Refill:  3    Patient Instructions  Medication Instructions:  Start: Crestor 20 mg daily             If you need a refill on your cardiac medications before your next appointment, please call  your pharmacy.   Lab work: Your physician recommends that you return for lab work in 3 months (Lipid, LFT)  If you have labs (blood work) drawn today and your tests are completely normal, you will receive your results only by: Marland Kitchen MyChart Message (if you have  MyChart) OR . A paper copy in the mail If you have any lab test that is abnormal or we need to change your treatment, we will call you to review the results.  Testing/Procedures: None  Follow-Up: At [facility], you and your health needs are our priority.  As part of our continuing mission to provide you with exceptional heart care, we have created designated Provider Care Teams.  These Care Teams include your primary Cardiologist (physician) and Advanced Practice Providers (APPs -  Physician Assistants and Nurse Practitioners) who all work together to provide you with the care you need, when you need it. You will need a follow up appointment in 3 months.  Please call our office 2 months in advance to schedule this appointment.  You may see Buford Dresser, MD or one of the following Advanced Practice Providers on your designated Care Team:   Rosaria Ferries, PA-C . Jory Sims, DNP, ANP    Mediterranean Diet A Mediterranean diet refers to food and lifestyle choices that are based on the traditions of countries located on the [employer]. This way of eating has been shown to help prevent certain conditions and improve outcomes for people who have chronic diseases, like kidney disease and heart disease. What are tips for following this plan? Lifestyle  Cook and eat meals together with your family, when possible.  Drink enough fluid to keep your urine clear or pale yellow.  Be physically active every day. This includes: ? Aerobic exercise like running or swimming. ? Leisure activities like gardening, walking, or housework.  Get 7-8 hours of sleep each night.  If recommended by your health care provider, drink  red wine in moderation. This means 1 glass a day for nonpregnant women and 2 glasses a day for men. A glass of wine equals 5 oz (150 mL). Reading food labels   Check the serving size of packaged foods. For foods such as rice and pasta, the serving size refers to the amount of cooked product, not dry.  Check the total fat in packaged foods. Avoid foods that have saturated fat or trans fats.  Check the ingredients list for added sugars, such as corn syrup. Shopping  At the grocery store, buy most of your food from the areas near the walls of the store. This includes: ? Fresh fruits and vegetables (produce). ? Grains, beans, nuts, and seeds. Some of these may be available in unpackaged forms or large amounts (in bulk). ? Fresh seafood. ? Poultry and eggs. ? Low-fat dairy products.  Buy whole ingredients instead of prepackaged foods.  Buy fresh fruits and vegetables in-season from local farmers markets.  Buy frozen fruits and vegetables in resealable bags.  If you do not have access to quality fresh seafood, buy precooked frozen shrimp or canned fish, such as tuna, salmon, or sardines.  Buy small amounts of raw or cooked vegetables, salads, or olives from the deli or salad bar at your store.  Stock your pantry so you always have certain foods on hand, such as olive oil, canned tuna, canned tomatoes, rice, pasta, and beans. Cooking  Cook foods with extra-virgin olive oil instead of using butter or other vegetable oils.  Have meat as a side dish, and have vegetables or grains as your main dish. This means having meat in small portions or adding small amounts of meat to foods like pasta or stew.  Use beans or vegetables instead of meat in common dishes like chili or lasagna.  Experiment with  different cooking methods. Try roasting or broiling vegetables instead of steaming or sauteing them.  Add frozen vegetables to soups, stews, pasta, or rice.  Add nuts or seeds for added healthy  fat at each meal. You can add these to yogurt, salads, or vegetable dishes.  Marinate fish or vegetables using olive oil, lemon juice, garlic, and fresh herbs. Meal planning   Plan to eat 1 vegetarian meal one day each week. Try to work up to 2 vegetarian meals, if possible.  Eat seafood 2 or more times a week.  Have healthy snacks readily available, such as: ? Vegetable sticks with hummus. ? [place] yogurt. ? Fruit and nut trail mix.  Eat balanced meals throughout the week. This includes: ? Fruit: 2-3 servings a day ? Vegetables: 4-5 servings a day ? Low-fat dairy: 2 servings a day ? Fish, poultry, or lean meat: 1 serving a day ? Beans and legumes: 2 or more servings a week ? Nuts and seeds: 1-2 servings a day ? Whole grains: 6-8 servings a day ? Extra-virgin olive oil: 3-4 servings a day  Limit red meat and sweets to only a few servings a month What are my food choices?  Mediterranean diet ? Recommended ? Grains: Whole-grain pasta. Brown rice. Bulgar wheat. Polenta. Couscous. Whole-wheat bread. [place]. ? Vegetables: Artichokes. Beets. Broccoli. Cabbage. Carrots. Eggplant. Green beans. Chard. Kale. Spinach. Onions. Leeks. Peas. Squash. Tomatoes. Peppers. Radishes. ? Fruits: Apples. Apricots. Avocado. Berries. Bananas. Cherries. Dates. Figs. Grapes. Lemons. Melon. Oranges. Peaches. Plums. Pomegranate. ? Meats and other protein foods: Beans. Almonds. Sunflower seeds. Pine nuts. Peanuts. [place]. Salmon. Scallops. Shrimp. [place]. Tilapia. Clams. Oysters. Eggs. ? Dairy: Low-fat milk. Cheese. Greek yogurt. ? Beverages: Water. Red wine. Herbal tea. ? Fats and oils: Extra virgin olive oil. Avocado oil. Grape seed oil. ? Sweets and desserts: [place] yogurt with honey. Baked apples. Poached pears. Trail mix. ? Seasoning and other foods: Basil. Cilantro. Coriander. Cumin. Mint. Parsley. Sage. Rosemary. Tarragon. Garlic. Oregano. Thyme. Pepper. Balsalmic vinegar. Tahini. Hummus. Tomato  sauce. Olives. Mushrooms. ? Limit these ? Grains: Prepackaged pasta or rice dishes. Prepackaged cereal with added sugar. ? Vegetables: Deep fried potatoes (french fries). ? Fruits: Fruit canned in syrup. ? Meats and other protein foods: Beef. Pork. Lamb. Poultry with skin. Hot dogs. [place] Salines. ? Dairy: Ice cream. Sour cream. Whole milk. ? Beverages: Juice. Sugar-sweetened soft drinks. Beer. Liquor and spirits. ? Fats and oils: Butter. Canola oil. Vegetable oil. Beef fat (tallow). Lard. ? Sweets and desserts: Cookies. Cakes. Pies. Candy. ? Seasoning and other foods: Mayonnaise. Premade sauces and marinades. ? The items listed may not be a complete list. Talk with your dietitian about what dietary choices are right for you. Summary  The Mediterranean diet includes both food and lifestyle choices.  Eat a variety of fresh fruits and vegetables, beans, nuts, seeds, and whole grains.  Limit the amount of red meat and sweets that you eat.  Talk with your health care provider about whether it is safe for you to drink red wine in moderation. This means 1 glass a day for nonpregnant women and 2 glasses a day for men. A glass of wine equals 5 oz (150 mL). This information is not intended to replace advice given to you by your health care provider. Make sure you discuss any questions you have with your health care provider. Document Released: 09/11/2015 Document Revised: 10/14/2015 Document Reviewed: 09/11/2015 Elsevier Interactive Patient Education  2019 [place], [place]  Harrell Gave, MD PhD 02/07/2018 5:34 PM    [place]

## 2018-02-07 NOTE — Patient Instructions (Addendum)
Medication Instructions:  Start: Crestor 20 mg daily             If you need a refill on your cardiac medications before your next appointment, please call your pharmacy.   Lab work: Your physician recommends that you return for lab work in 3 months (Lipid, LFT)  If you have labs (blood work) drawn today and your tests are completely normal, you will receive your results only by: Marland Kitchen MyChart Message (if you have MyChart) OR . A paper copy in the mail If you have any lab test that is abnormal or we need to change your treatment, we will call you to review the results.  Testing/Procedures: None  Follow-Up: At [facility], you and your health needs are our priority.  As part of our continuing mission to provide you with exceptional heart care, we have created designated Provider Care Teams.  These Care Teams include your primary Cardiologist (physician) and Advanced Practice Providers (APPs -  Physician Assistants and Nurse Practitioners) who all work together to provide you with the care you need, when you need it. You will need a follow up appointment in 3 months.  Please call our office 2 months in advance to schedule this appointment.  You may see Buford Dresser, MD or one of the following Advanced Practice Providers on your designated Care Team:   Rosaria Ferries, PA-C . Jory Sims, DNP, ANP    Mediterranean Diet A Mediterranean diet refers to food and lifestyle choices that are based on the traditions of countries located on the [employer]. This way of eating has been shown to help prevent certain conditions and improve outcomes for people who have chronic diseases, like kidney disease and heart disease. What are tips for following this plan? Lifestyle  Cook and eat meals together with your family, when possible.  Drink enough fluid to keep your urine clear or pale yellow.  Be physically active every day. This includes: ? Aerobic exercise like running or  swimming. ? Leisure activities like gardening, walking, or housework.  Get 7-8 hours of sleep each night.  If recommended by your health care provider, drink red wine in moderation. This means 1 glass a day for nonpregnant women and 2 glasses a day for men. A glass of wine equals 5 oz (150 mL). Reading food labels   Check the serving size of packaged foods. For foods such as rice and pasta, the serving size refers to the amount of cooked product, not dry.  Check the total fat in packaged foods. Avoid foods that have saturated fat or trans fats.  Check the ingredients list for added sugars, such as corn syrup. Shopping  At the grocery store, buy most of your food from the areas near the walls of the store. This includes: ? Fresh fruits and vegetables (produce). ? Grains, beans, nuts, and seeds. Some of these may be available in unpackaged forms or large amounts (in bulk). ? Fresh seafood. ? Poultry and eggs. ? Low-fat dairy products.  Buy whole ingredients instead of prepackaged foods.  Buy fresh fruits and vegetables in-season from local farmers markets.  Buy frozen fruits and vegetables in resealable bags.  If you do not have access to quality fresh seafood, buy precooked frozen shrimp or canned fish, such as tuna, salmon, or sardines.  Buy small amounts of raw or cooked vegetables, salads, or olives from the deli or salad bar at your store.  Stock your pantry so you always have certain foods  on hand, such as olive oil, canned tuna, canned tomatoes, rice, pasta, and beans. Cooking  Cook foods with extra-virgin olive oil instead of using butter or other vegetable oils.  Have meat as a side dish, and have vegetables or grains as your main dish. This means having meat in small portions or adding small amounts of meat to foods like pasta or stew.  Use beans or vegetables instead of meat in common dishes like chili or lasagna.  Experiment with different cooking methods. Try  roasting or broiling vegetables instead of steaming or sauteing them.  Add frozen vegetables to soups, stews, pasta, or rice.  Add nuts or seeds for added healthy fat at each meal. You can add these to yogurt, salads, or vegetable dishes.  Marinate fish or vegetables using olive oil, lemon juice, garlic, and fresh herbs. Meal planning   Plan to eat 1 vegetarian meal one day each week. Try to work up to 2 vegetarian meals, if possible.  Eat seafood 2 or more times a week.  Have healthy snacks readily available, such as: ? Vegetable sticks with hummus. ? [place] yogurt. ? Fruit and nut trail mix.  Eat balanced meals throughout the week. This includes: ? Fruit: 2-3 servings a day ? Vegetables: 4-5 servings a day ? Low-fat dairy: 2 servings a day ? Fish, poultry, or lean meat: 1 serving a day ? Beans and legumes: 2 or more servings a week ? Nuts and seeds: 1-2 servings a day ? Whole grains: 6-8 servings a day ? Extra-virgin olive oil: 3-4 servings a day  Limit red meat and sweets to only a few servings a month What are my food choices?  Mediterranean diet ? Recommended ? Grains: Whole-grain pasta. Brown rice. Bulgar wheat. Polenta. Couscous. Whole-wheat bread. [place]. ? Vegetables: Artichokes. Beets. Broccoli. Cabbage. Carrots. Eggplant. Green beans. Chard. Kale. Spinach. Onions. Leeks. Peas. Squash. Tomatoes. Peppers. Radishes. ? Fruits: Apples. Apricots. Avocado. Berries. Bananas. Cherries. Dates. Figs. Grapes. Lemons. Melon. Oranges. Peaches. Plums. Pomegranate. ? Meats and other protein foods: Beans. Almonds. Sunflower seeds. Pine nuts. Peanuts. [place]. Salmon. Scallops. Shrimp. [place]. Tilapia. Clams. Oysters. Eggs. ? Dairy: Low-fat milk. Cheese. Greek yogurt. ? Beverages: Water. Red wine. Herbal tea. ? Fats and oils: Extra virgin olive oil. Avocado oil. Grape seed oil. ? Sweets and desserts: [place] yogurt with honey. Baked apples. Poached pears. Trail mix. ? Seasoning  and other foods: Basil. Cilantro. Coriander. Cumin. Mint. Parsley. Sage. Rosemary. Tarragon. Garlic. Oregano. Thyme. Pepper. Balsalmic vinegar. Tahini. Hummus. Tomato sauce. Olives. Mushrooms. ? Limit these ? Grains: Prepackaged pasta or rice dishes. Prepackaged cereal with added sugar. ? Vegetables: Deep fried potatoes (french fries). ? Fruits: Fruit canned in syrup. ? Meats and other protein foods: Beef. Pork. Lamb. Poultry with skin. Hot dogs. Berniece Salines. ? Dairy: Ice cream. Sour cream. Whole milk. ? Beverages: Juice. Sugar-sweetened soft drinks. Beer. Liquor and spirits. ? Fats and oils: Butter. Canola oil. Vegetable oil. Beef fat (tallow). Lard. ? Sweets and desserts: Cookies. Cakes. Pies. Candy. ? Seasoning and other foods: Mayonnaise. Premade sauces and marinades. ? The items listed may not be a complete list. Talk with your dietitian about what dietary choices are right for you. Summary  The Mediterranean diet includes both food and lifestyle choices.  Eat a variety of fresh fruits and vegetables, beans, nuts, seeds, and whole grains.  Limit the amount of red meat and sweets that you eat.  Talk with your health care provider about whether it is safe for you  to drink red wine in moderation. This means 1 glass a day for nonpregnant women and 2 glasses a day for men. A glass of wine equals 5 oz (150 mL). This information is not intended to replace advice given to you by your health care provider. Make sure you discuss any questions you have with your health care provider. Document Released: 09/11/2015 Document Revised: 10/14/2015 Document Reviewed: 09/11/2015 Elsevier Interactive Patient Education  2019 [employer].

## 2018-05-04 ENCOUNTER — Telehealth: Payer: Self-pay

## 2018-05-04 NOTE — Telephone Encounter (Signed)
TELEPHONE CALL NOTE  Seryna Marek Rankin has been deemed a candidate for a follow-up tele-health visit to limit community exposure during the Covid-19 pandemic. I spoke with the patient via phone to ensure availability of phone/video source, confirm preferred email & phone number, and discuss instructions and expectations.  I reminded Merina Behrendt Gilvin to be prepared with any vital sign and/or heart rhythm information that could potentially be obtained via home monitoring, at the time of her visit. I reminded Laterrica Libman Hinderliter to expect a phone call at the time of her visit if her visit.  Did the patient verbally acknowledge consent to treatment? Yes  Meryl Crutch, RN 05/04/2018 5:02 PM   DOWNLOADING THE [place] TO SMARTPHONE  - If Apple, go to [employer] and type in WebEx in the search bar. [place] [employer], the blue/green circle. The app is free but as with any other app downloads, their phone may require them to verify saved payment information or Apple password. The patient does NOT have to create an account.  - If Android, ask patient to go to [employer] and type in WebEx in the search bar. [place] [employer], the blue/green circle. The app is free but as with any other app downloads, their phone may require them to verify saved payment information or Android password. The patient does NOT have to create an account.   CONSENT FOR TELE-HEALTH VISIT - PLEASE REVIEW  I hereby voluntarily request, consent and authorize [facility] and its employed or contracted physicians, physician assistants, nurse practitioners or other licensed health care professionals (the Practitioner), to provide me with telemedicine health care services (the "Services") as deemed necessary by the treating Practitioner. I acknowledge and consent to receive the Services by the Practitioner via telemedicine. I understand that the telemedicine visit will involve communicating with the  Practitioner through live audiovisual communication technology and the disclosure of certain medical information by electronic transmission. I acknowledge that I have been given the opportunity to request an in-person assessment or other available alternative prior to the telemedicine visit and am voluntarily participating in the telemedicine visit.  I understand that I have the right to withhold or withdraw my consent to the use of telemedicine in the course of my care at any time, without affecting my right to future care or treatment, and that the Practitioner or I may terminate the telemedicine visit at any time. I understand that I have the right to inspect all information obtained and/or recorded in the course of the telemedicine visit and may receive copies of available information for a reasonable fee.  I understand that some of the potential risks of receiving the Services via telemedicine include:  Marland Kitchen Delay or interruption in medical evaluation due to technological equipment failure or disruption; . Information transmitted may not be sufficient (e.g. poor resolution of images) to allow for appropriate medical decision making by the Practitioner; and/or  . In rare instances, security protocols could fail, causing a breach of personal health information.  Furthermore, I acknowledge that it is my responsibility to provide information about my medical history, conditions and care that is complete and accurate to the best of my ability. I acknowledge that Practitioner's advice, recommendations, and/or decision may be based on factors not within their control, such as incomplete or inaccurate data provided by me or distortions of diagnostic images or specimens that may result from electronic transmissions. I understand that the practice of medicine is not an  exact science and that Practitioner makes no warranties or guarantees regarding treatment outcomes. I acknowledge that I will receive a copy of this  consent concurrently upon execution via email to the email address I last provided but may also request a printed copy by calling the office of [facility].    I understand that my insurance will be billed for this visit.   I have read or had this consent read to me. . I understand the contents of this consent, which adequately explains the benefits and risks of the Services being provided via telemedicine.  . I have been provided ample opportunity to ask questions regarding this consent and the Services and have had my questions answered to my satisfaction. . I give my informed consent for the services to be provided through the use of telemedicine in my medical care  By participating in this telemedicine visit I agree to the above.

## 2018-05-04 NOTE — Telephone Encounter (Signed)
Appointment rescheduled to virtual visit.

## 2018-05-04 NOTE — Telephone Encounter (Signed)
Attempted to contact pt to change 4/6 appointment to virtual visit. Left message to call back.

## 2018-05-04 NOTE — Telephone Encounter (Signed)
Follow up;    Patient returning call back concerning her up coming ppt. Please call patient back.

## 2018-05-08 ENCOUNTER — Other Ambulatory Visit: Payer: Self-pay

## 2018-05-08 ENCOUNTER — Telehealth (INDEPENDENT_AMBULATORY_CARE_PROVIDER_SITE_OTHER): Payer: 59 | Admitting: Cardiology

## 2018-05-08 ENCOUNTER — Encounter: Payer: Self-pay | Admitting: Cardiology

## 2018-05-08 VITALS — Ht 62.0 in | Wt 145.0 lb

## 2018-05-08 DIAGNOSIS — I2583 Coronary atherosclerosis due to lipid rich plaque: Secondary | ICD-10-CM

## 2018-05-08 DIAGNOSIS — I2510 Atherosclerotic heart disease of native coronary artery without angina pectoris: Secondary | ICD-10-CM | POA: Diagnosis not present

## 2018-05-08 DIAGNOSIS — E7800 Pure hypercholesterolemia, unspecified: Secondary | ICD-10-CM

## 2018-05-08 NOTE — Patient Instructions (Signed)
Medication Instructions:  Your Physician recommend you continue on your current medication as directed.    If you need a refill on your cardiac medications before your next appointment, please call your pharmacy.   Lab work: Your physician recommends that you return for lab work in 1 week (lipid, LFT)   Testing/Procedures: None  Follow-Up: At [facility], you and your health needs are our priority.  As part of our continuing mission to provide you with exceptional heart care, we have created designated Provider Care Teams.  These Care Teams include your primary Cardiologist (physician) and Advanced Practice Providers (APPs -  Physician Assistants and Nurse Practitioners) who all work together to provide you with the care you need, when you need it. You will need a follow up appointment in 6 months.  Please call our office 2 months in advance to schedule this appointment.  You may see Buford Dresser, MD or one of the following Advanced Practice Providers on your designated Care Team:   Rosaria Ferries, PA-C . Jory Sims, DNP, ANP

## 2018-05-08 NOTE — Progress Notes (Signed)
Virtual Visit via Telephone Note   This visit type was conducted due to national recommendations for restrictions regarding the COVID-19 Pandemic (e.g. social distancing) in an effort to limit this patient's exposure and mitigate transmission in our community.  Due to her co-morbid illnesses, this patient is at least at moderate risk for complications without adequate follow up.  This format is felt to be most appropriate for this patient at this time.  The patient did not have access to video technology/had technical difficulties with video requiring transitioning to audio format only (telephone).  All issues noted in this document were discussed and addressed.  No physical exam could be performed with this format.  Please refer to the patient's chart for her  consent to telehealth for [facility].   Evaluation Performed:  Follow-up visit  Date:  05/08/2018   ID:  Monica Camacho, DOB January 19, 1951, MRN [id number]  Patient Location: Home  Provider Location: Office  PCP:  Leighton Ruff, MD  Cardiologist:  Buford Dresser, MD  Chief Complaint:  Follow up  History of Present Illness:    Monica Camacho is a 68 y.o. female who presents via audio/video conferencing for a telehealth visit today.    The patient does not have symptoms concerning for COVID-19 infection (fever, chills, cough, or new shortness of breath).   She is doing very well overall. Has not noticed chest pain since starting the rosuvastatin. Tolerating very well, only thing she noticed is intermittent mild insomnia. She is currently taking this in the evening, discussed that she can take it any time.   Denies shortness of breath at rest or with normal exertion. No PND, orthopnea, LE edema or unexpected weight gain. No syncope or palpitations.   Past Medical History:  Diagnosis Date  . Thyroid disease    Past Surgical History:  Procedure Laterality Date  . BACK SURGERY    . CESAREAN SECTION    .  LAPAROSCOPIC BILATERAL SALPINGO OOPHERECTOMY       Current Meds  Medication Sig  . levothyroxine (SYNTHROID, LEVOTHROID) 112 MCG tablet Take 112 mcg by mouth daily.  . rosuvastatin (CRESTOR) 20 MG tablet Take 1 tablet (20 mg total) by mouth daily at 6 PM.  . Vitamin D, Ergocalciferol, 2000 units CAPS Take 2,000 Units by mouth daily.     Allergies:   Penicillins   Social History   Tobacco Use  . Smoking status: Former Research scientist (life sciences)  . Smokeless tobacco: Never Used  Substance Use Topics  . Alcohol use: No  . Drug use: No     Family Hx: The patient's family history includes Heart attack (age of onset: 45) in her brother; Heart disease in her father, mother, and sister; Heart failure in her mother; Stroke in her father.  ROS:   Please see the history of present illness.    All other systems reviewed and are negative.   Prior CV studies:   The following studies were reviewed today: CT cardiac  Labs/Other Tests and Data Reviewed:    EKG:  An ECG dated 10/26/17 was personally reviewed today and demonstrated:  normal sinus rhythm  Recent Labs: 12/22/2017: BUN 16; Creatinine, Ser 1.14; Potassium 4.4; Sodium 144 01/09/2018: ALT 14   Recent Lipid Panel Lab Results  Component Value Date/Time   CHOL 203 (H) 01/09/2018 09:12 AM   TRIG 70 01/09/2018 09:12 AM   HDL 56 01/09/2018 09:12 AM   CHOLHDL 3.6 01/09/2018 09:12 AM   LDLCALC 133 (H) 01/09/2018 09:12 AM  Wt Readings from Last 3 Encounters:  05/08/18 145 lb (65.8 kg)  02/07/18 142 lb (64.4 kg)  10/26/17 141 lb (64 kg)     Objective:    Vital Signs:  Ht 5\' 2"  (1.575 m)   Wt 145 lb (65.8 kg)   BMI 26.52 kg/m    ASSESSMENT & PLAN:    CAD based on CT cardiac: -LDL goal <70. Has been on rosuvastatin for several months. Has orders for LFTs and lipids, discussed that she can come and get these any time without an appt in the office, no need to wait, minimal exposure risk. She can also wait until the COVID peak has passed. I  am fine with either. She will think about it -chest pain resolved -continue rosuvastatin -discussed aspirin again, she would prefer not to. Will think about it and let me know at follow up -instructed on red flag warning signs, when to seek immediate medical attention.  COVID-19 Education: The signs and symptoms of COVID-19 were discussed with the patient and how to seek care for testing (follow up with PCP or arrange E-visit).  The importance of social distancing was discussed today.  Time:   Today, I have spent 11 minutes with the patient with telehealth technology discussing the above problems.    Patient Instructions  Medication Instructions:  Your Physician recommend you continue on your current medication as directed.    If you need a refill on your cardiac medications before your next appointment, please call your pharmacy.   Lab work: Your physician recommends that you return for lab work in 1 week (lipid, LFT)   Testing/Procedures: None  Follow-Up: At [facility], you and your health needs are our priority.  As part of our continuing mission to provide you with exceptional heart care, we have created designated Provider Care Teams.  These Care Teams include your primary Cardiologist (physician) and Advanced Practice Providers (APPs -  Physician Assistants and Nurse Practitioners) who all work together to provide you with the care you need, when you need it. You will need a follow up appointment in 6 months.  Please call our office 2 months in advance to schedule this appointment.  You may see Buford Dresser, MD or one of the following Advanced Practice Providers on your designated Care Team:   Rosaria Ferries, PA-C . Jory Sims, DNP, ANP     Medication Adjustments/Labs and Tests Ordered: Current medicines are reviewed at length with the patient today.  Concerns regarding medicines are outlined above.  Tests Ordered: No orders of the defined types were  placed in this encounter.  Medication Changes: No orders of the defined types were placed in this encounter.   Disposition:  Follow up 5-6 months  Signed, Buford Dresser, MD  05/08/2018 1:57 PM    [facility]

## 2018-07-03 IMAGING — MG 2D DIGITAL DIAGNOSTIC BILATERAL MAMMOGRAM WITH IMPLANTS, CAD AND
8 of 19 series · 8 of 39 positions shown · non-contrast
Comparison: Previous exam(s).

CLINICAL DATA: Do left retroareolar breast pain. Patient has
bilateral saline implants placed in 0445.

EXAM:
2D DIGITAL DIAGNOSTIC BILATERAL MAMMOGRAM WITH IMPLANTS, CAD AND
ADJUNCT TOMO
ULTRASOUND LEFT BREAST
The patient has retroglandular implants. Standard and implant
displaced views were performed.

[L MLO]
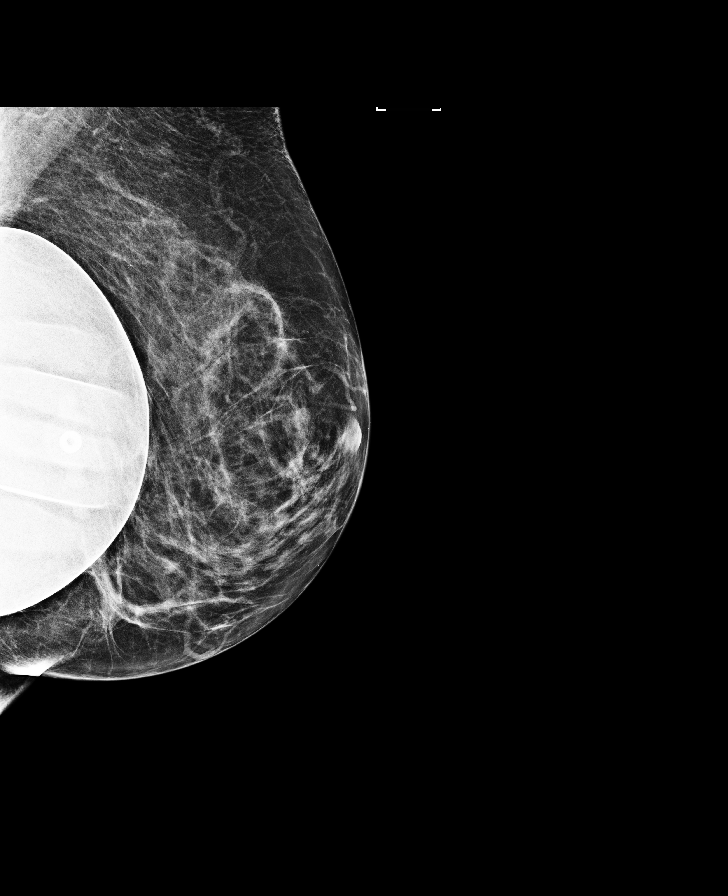

[R CC]
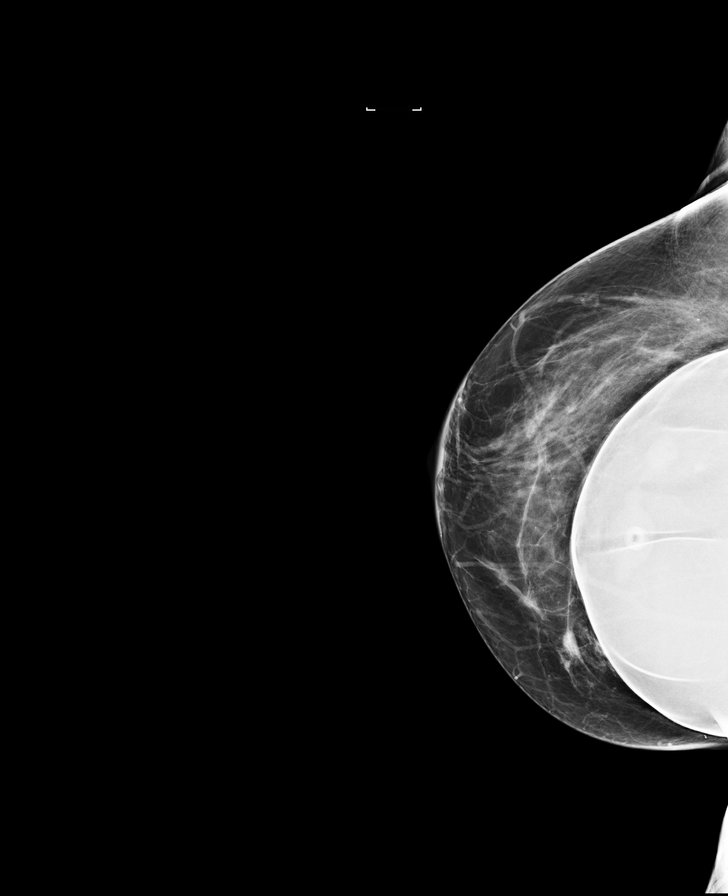

[R MLO (1 of 2)]
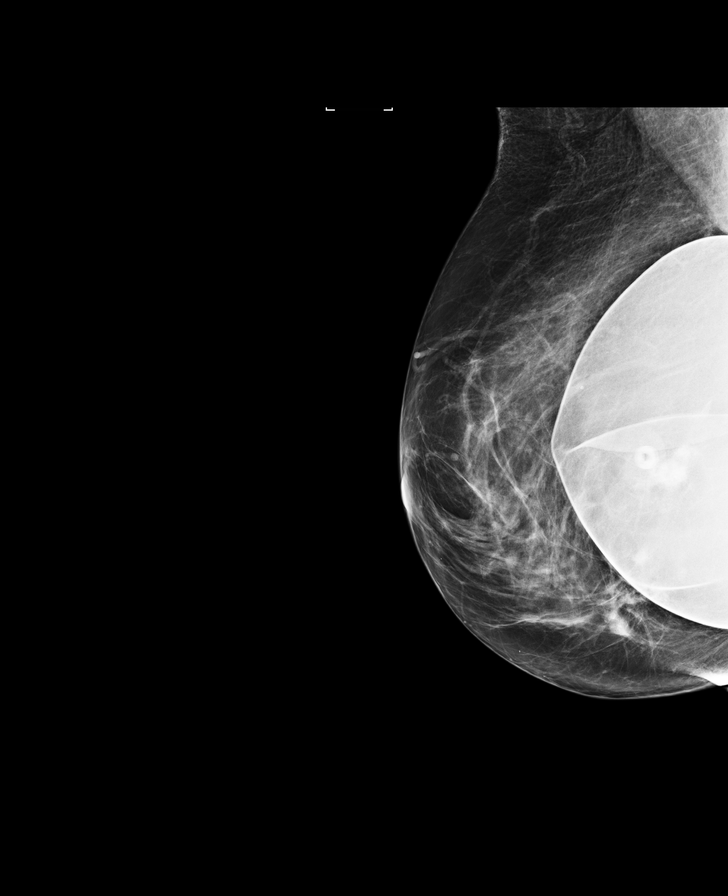

[L CC (1 of 2)]
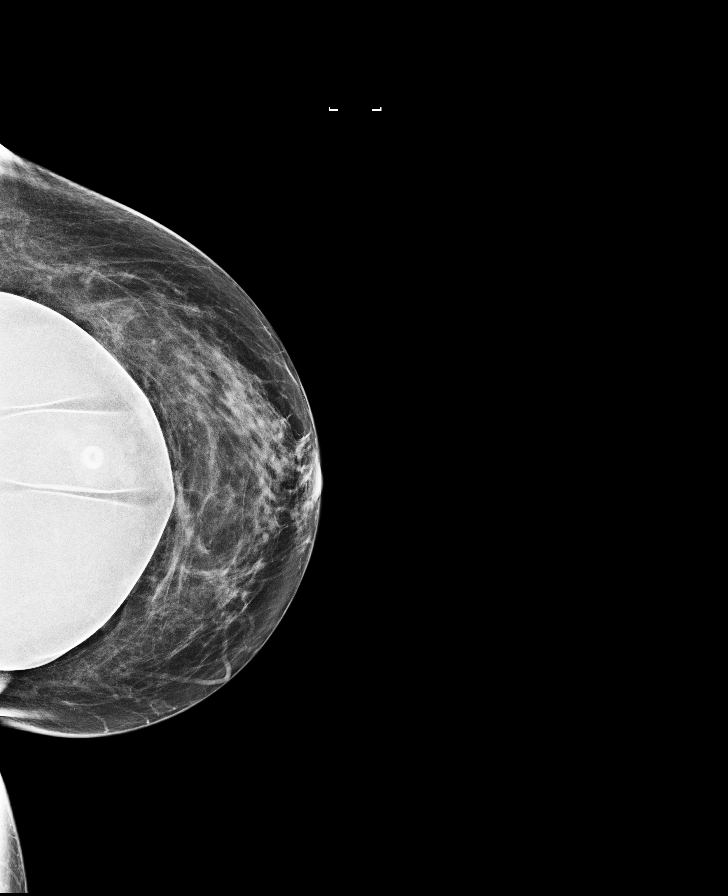

[R MLO (2 of 2)]
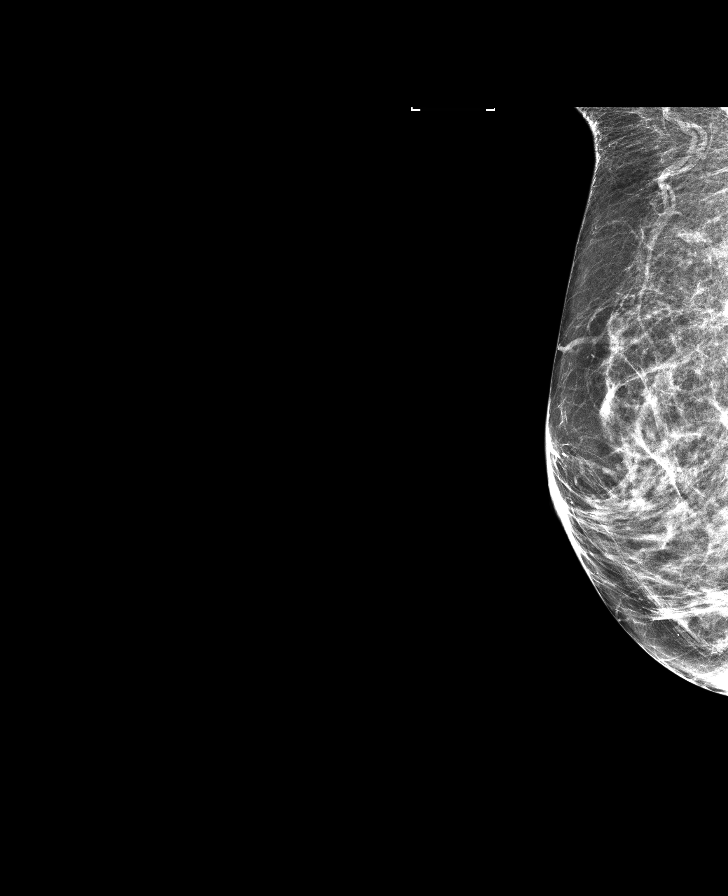

[L TAN]
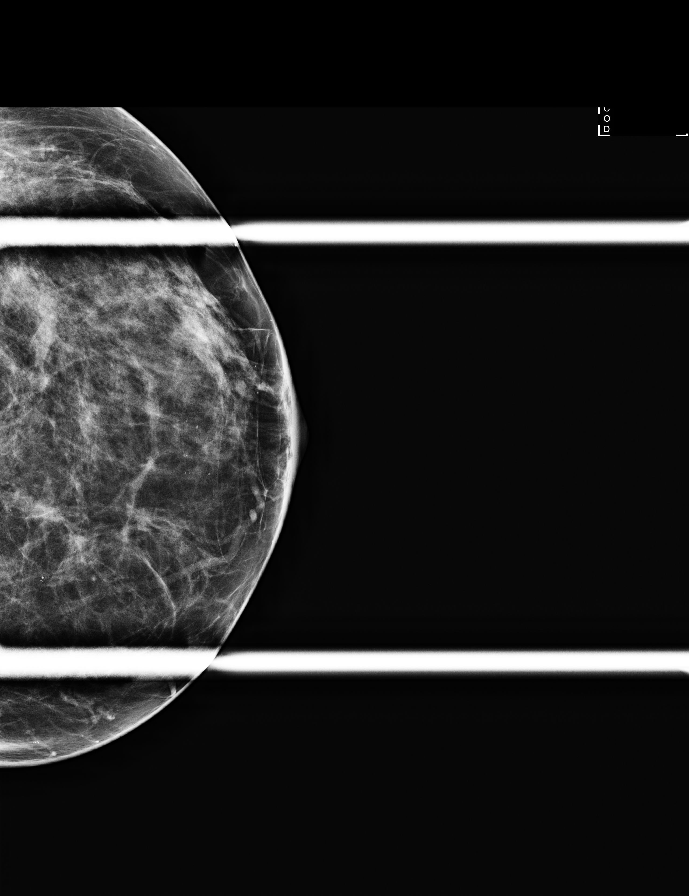

[L CC (2 of 2)]
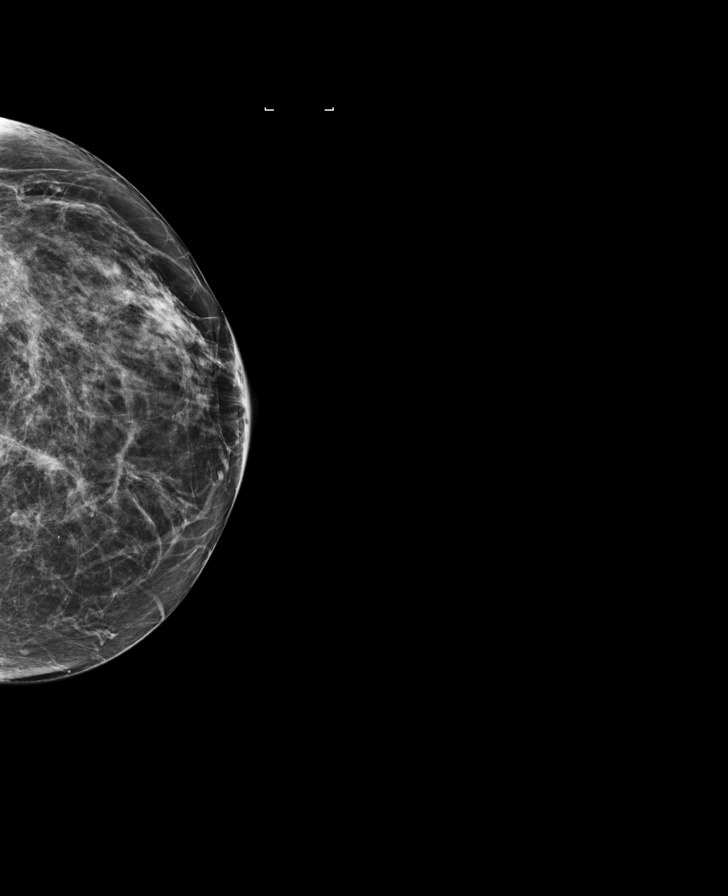

[L CC synth-2D]
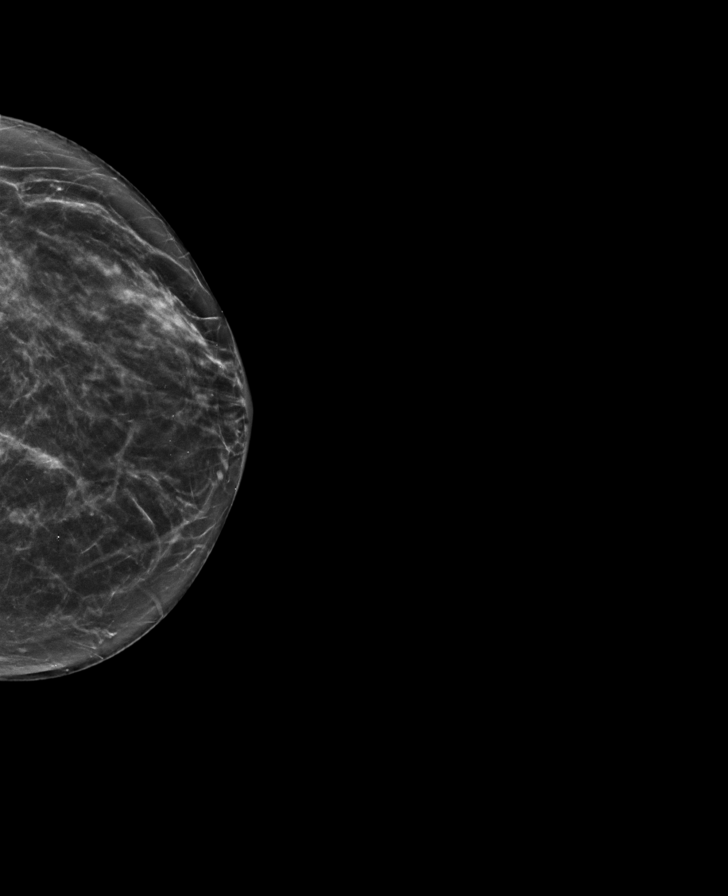

[8 of 39 positions shown; findings below may reference images not displayed]

ACR Breast Density Category c: The breast tissue is heterogeneously
dense, which may obscure small masses.
FINDINGS: Mammographically, there are no suspicious masses, areas of
nonsurgical architectural distortion or microcalcifications in
either breast. Focal asymmetry in the right breast lower inner
quadrant is stable from 9686 and likely represents silicone
extravasation.

On physical exam, no suspicious masses are palpated.

Targeted ultrasound is performed, showing no suspicious masses or
shadowing lesions.

Mammographic images were processed with CAD.
IMPRESSION: No mammographic or sonographic evidence of malignancy in either
breast.

RECOMMENDATION:
Further management patient's left breast pain should be based on
clinical grounds.

Otherwise, screening mammogram in one year.(Code:[id number])

I have discussed the findings and recommendations with the patient.
Results were also provided in writing at the conclusion of the
visit. If applicable, a reminder letter will be sent to the patient
regarding the next appointment.

BI-RADS CATEGORY  2: Benign.

## 2019-01-01 LAB — LIPID PANEL
Chol/HDL Ratio: 2.8 ratio (ref 0.0–4.4)
Cholesterol, Total: 172 mg/dL (ref 100–199)
HDL: 62 mg/dL (ref >39)
LDL Chol Calc (NIH): 96 mg/dL (ref 0–99)
Triglycerides: 72 mg/dL (ref 0–149)
VLDL Cholesterol Cal: 14 mg/dL (ref 5–40)

## 2019-01-01 LAB — HEPATIC FUNCTION PANEL
ALT: 10 IU/L (ref 0–32)
AST: 14 IU/L (ref 0–40)
Albumin: 4.5 g/dL (ref 3.8–4.8)
Alkaline Phosphatase: 77 IU/L (ref 39–117)
Bilirubin Total: 0.4 mg/dL (ref 0.0–1.2)
Bilirubin, Direct: 0.1 mg/dL (ref 0.00–0.40)
Total Protein: 6.2 g/dL (ref 6.0–8.5)

## 2019-01-17 ENCOUNTER — Other Ambulatory Visit: Payer: Self-pay | Admitting: Cardiology

## 2019-01-31 ENCOUNTER — Other Ambulatory Visit: Payer: Self-pay | Admitting: Endocrinology

## 2019-01-31 DIAGNOSIS — E049 Nontoxic goiter, unspecified: Secondary | ICD-10-CM

## 2019-02-12 ENCOUNTER — Ambulatory Visit
Admission: RE | Admit: 2019-02-12 | Discharge: 2019-02-12 | Disposition: A | Discharge status: Home / Self Care | Payer: PPO | Source: Ambulatory Visit | Attending: Endocrinology | Admitting: Endocrinology

## 2019-02-12 DIAGNOSIS — E041 Nontoxic single thyroid nodule: Secondary | ICD-10-CM | POA: Diagnosis not present

## 2019-02-12 DIAGNOSIS — E049 Nontoxic goiter, unspecified: Secondary | ICD-10-CM

## 2019-02-26 DIAGNOSIS — E039 Hypothyroidism, unspecified: Secondary | ICD-10-CM | POA: Diagnosis not present

## 2019-03-02 DIAGNOSIS — E049 Nontoxic goiter, unspecified: Secondary | ICD-10-CM | POA: Diagnosis not present

## 2019-03-02 DIAGNOSIS — E039 Hypothyroidism, unspecified: Secondary | ICD-10-CM | POA: Diagnosis not present

## 2019-03-14 IMAGING — US US THYROID
1 series · 14 of 25 positions shown · non-contrast
Comparison: 11/01/2013

CLINICAL DATA: Goiter.

EXAM:
THYROID ULTRASOUND
TECHNIQUE: Ultrasound examination of the thyroid gland and adjacent soft
tissues was performed.

[Series 1: us thyroid · 0.07mm/px · 48 acquisitions, 14 frames shown]
[im 1/48]
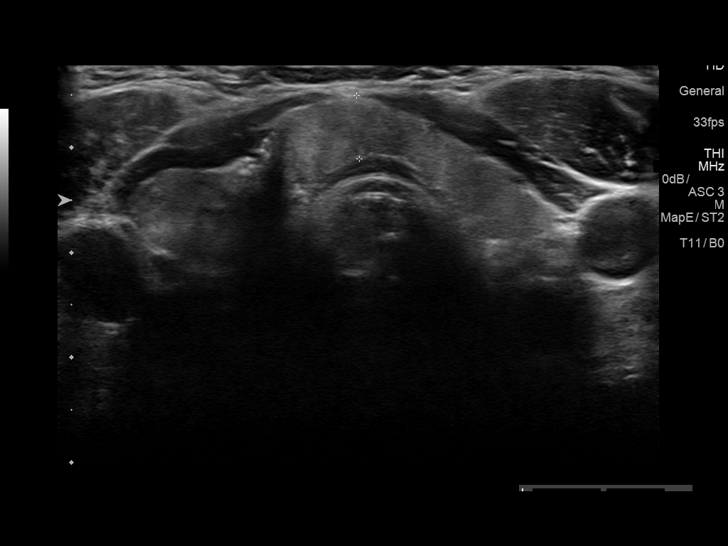
[im 4/48]
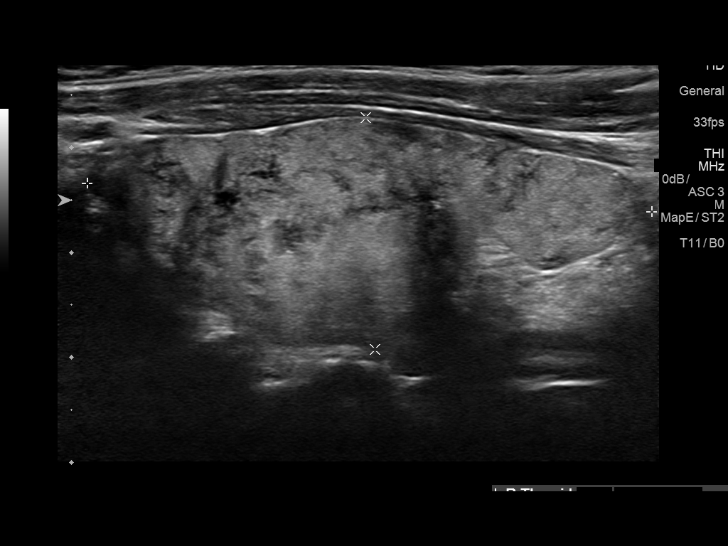
[im 8/48]
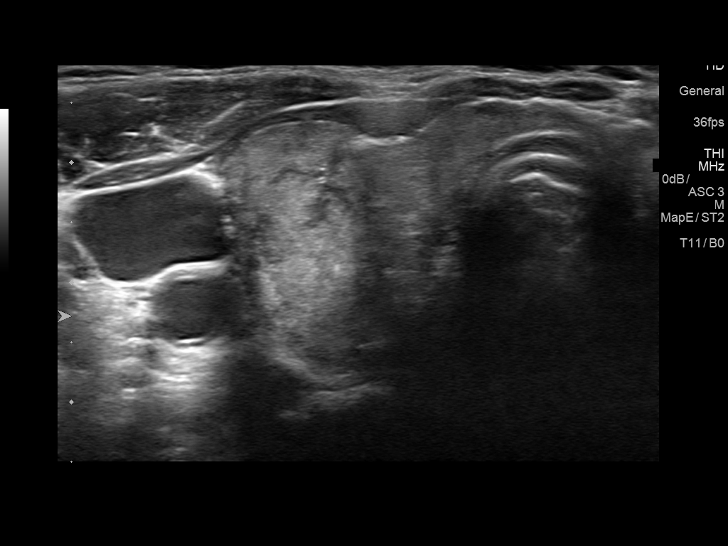
[im 12/48]
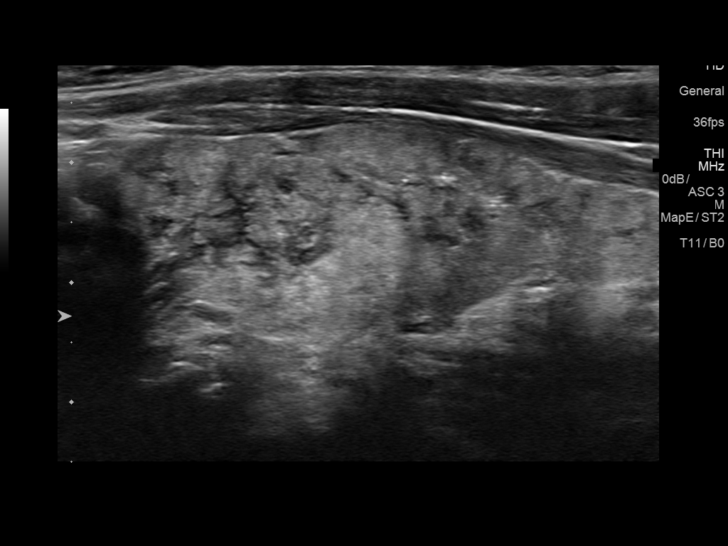
[im 16/48]
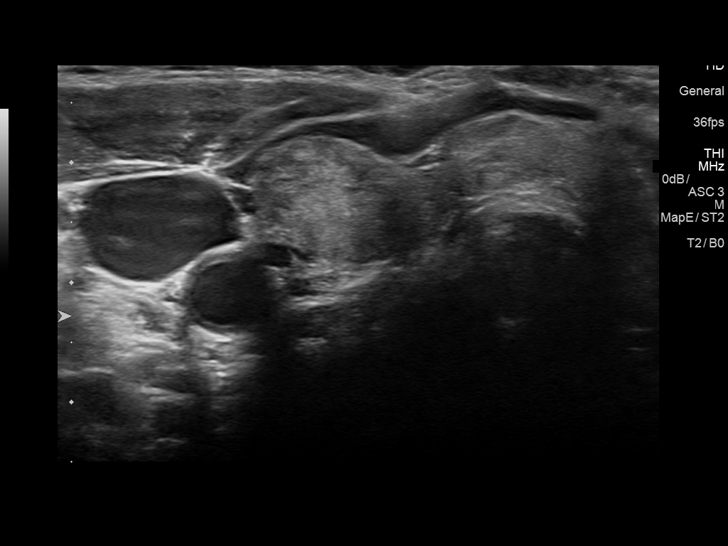
[im 18/48]
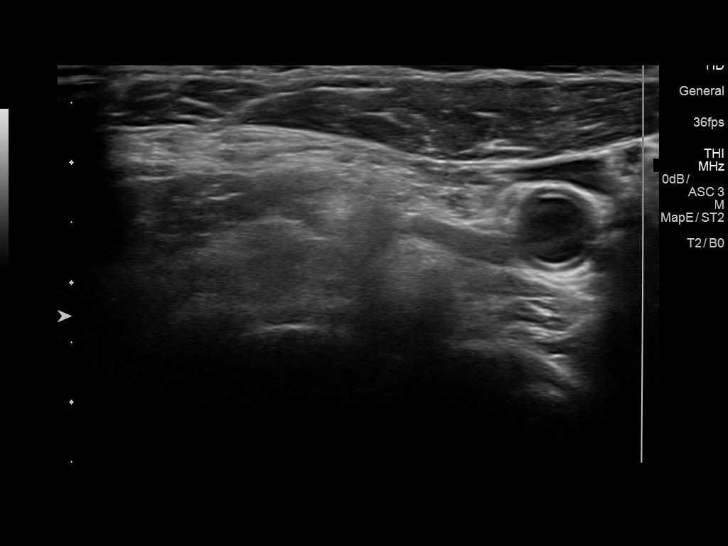
[im 22/48]
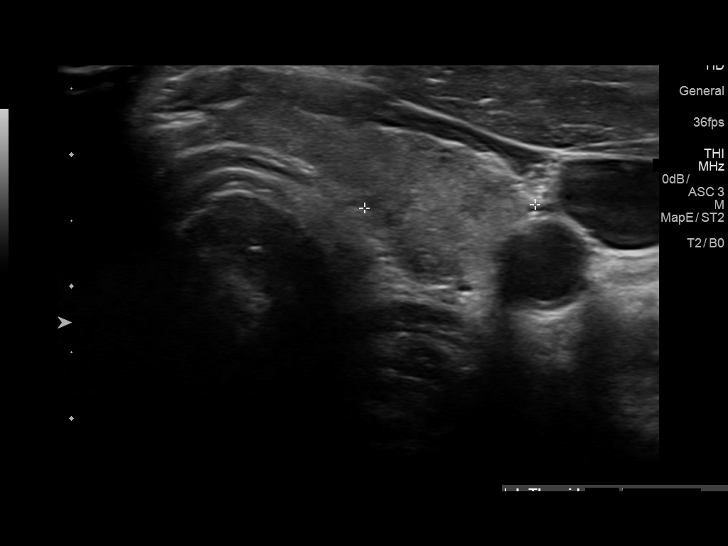
[im 26/48]
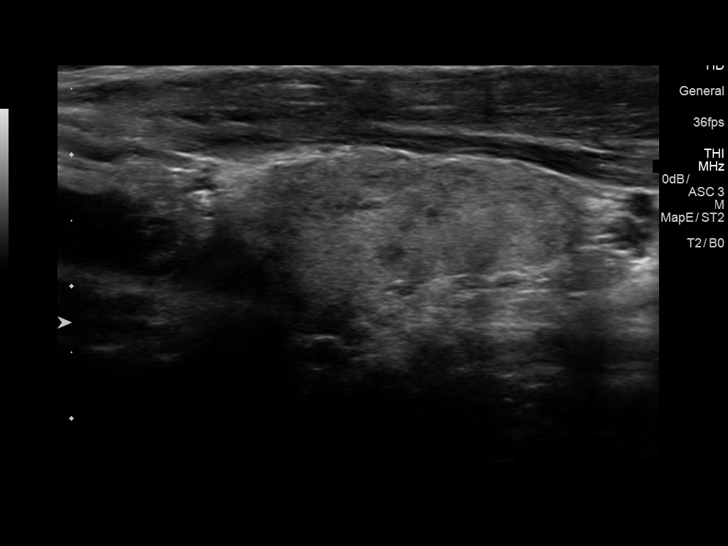
[im 30/48]
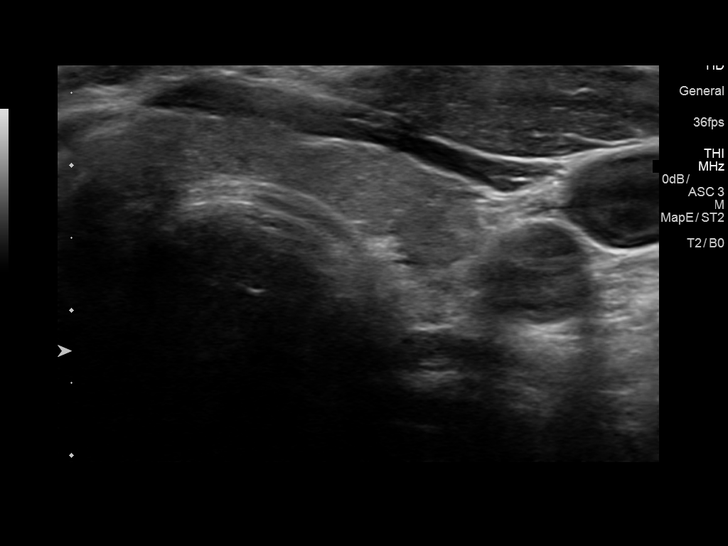
[im 32/48]
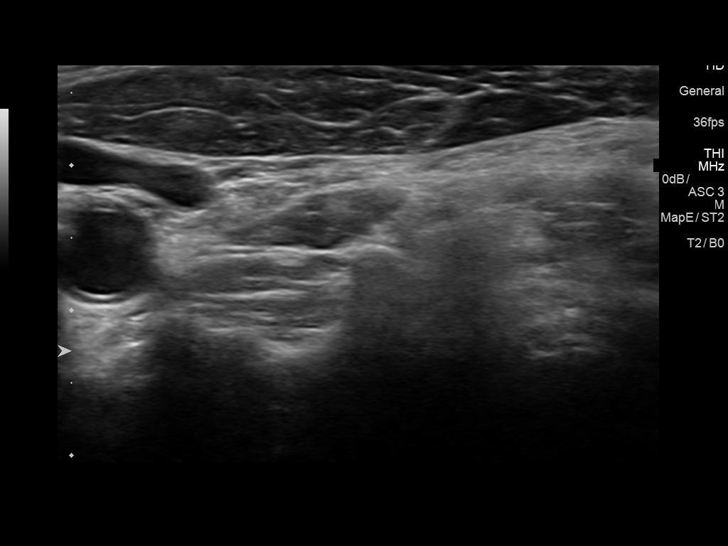
[im 36/48]
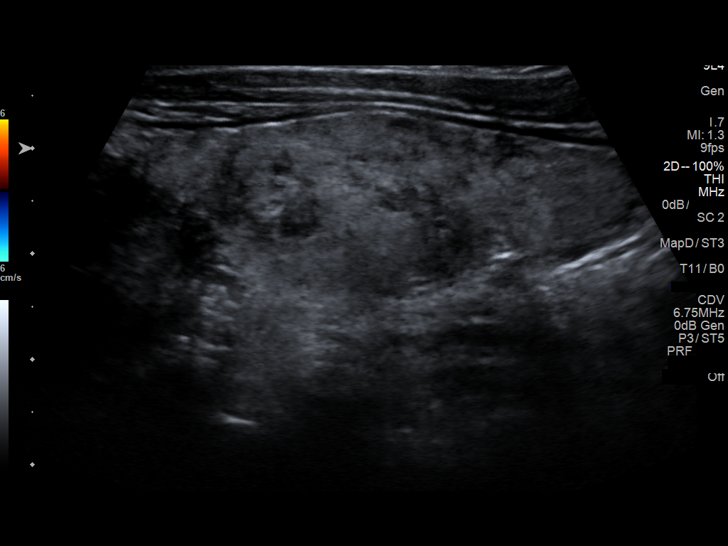
[im 40/48]
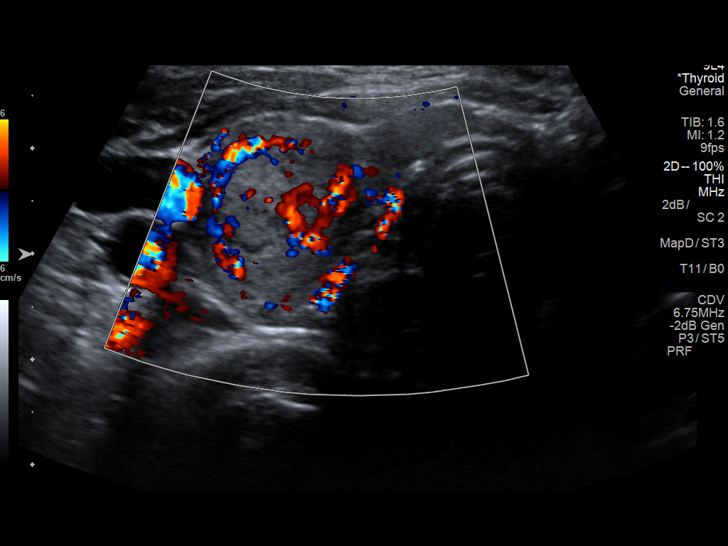
[im 44/48]
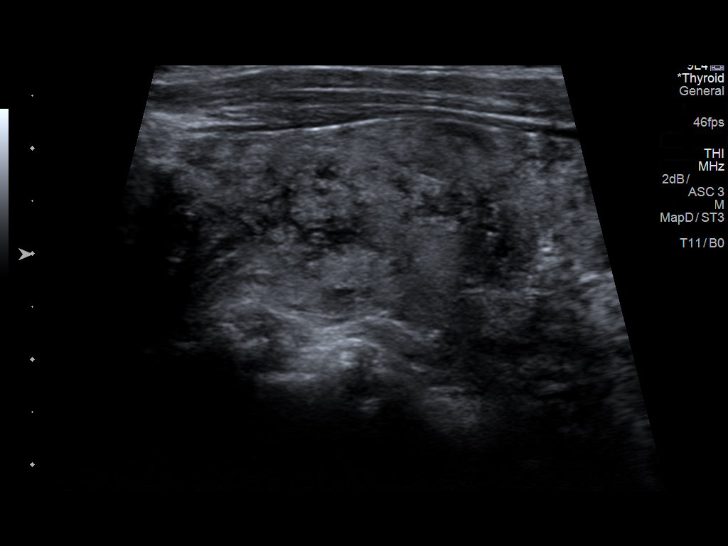
[im 48/48]
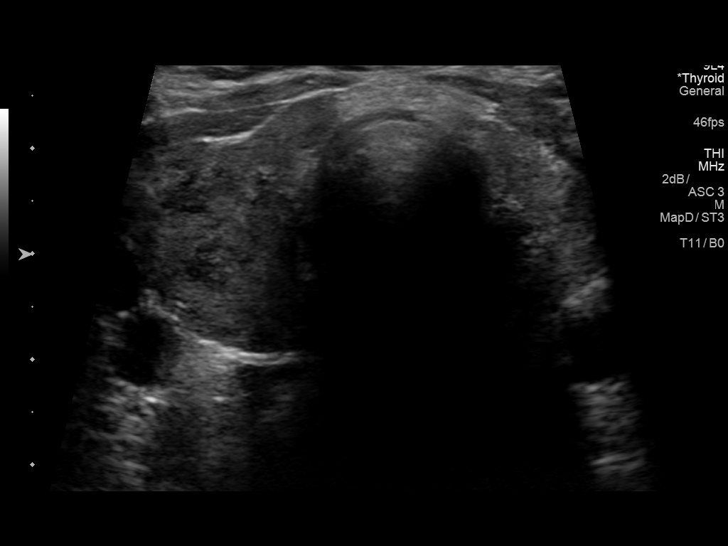

[14 of 25 positions shown; findings below may reference images not displayed]

FINDINGS: Parenchymal Echotexture: Markedly heterogenous

Isthmus:

Right lobe: 5.4 x 2.2 x 2.0 cm, previously 5.5 x 2.1 x 2.4 cm

Left lobe: 2.9 x 1.1.3 cm, previously 3.8 x 1.2 x 1.2 cm

_________________________________________________________

Estimated total number of nodules >/= 1 cm: 0

Number of spongiform nodules >/=  2 cm not described below (TR1): 0

Number of mixed cystic and solid nodules >/= 1.5 cm not described
below (TR2): 0

_________________________________________________________

Similar marked heterogeneity and asymmetric enlargement of the right
lobe. Right lobe now has a diffuse pseudo nodular appearance.
Similar asymmetric hypervascularity on the right. No discrete
measurable nodule on today's exam. No adenopathy.
IMPRESSION: Stable marked heterogeneity and right lobe enlargement. No
significant interval change or new finding.

The above is in keeping with the ACR TI-RADS recommendations - [HOSPITAL] 0650;[DATE].

## 2019-05-01 DIAGNOSIS — E039 Hypothyroidism, unspecified: Secondary | ICD-10-CM | POA: Insufficient documentation

## 2019-05-01 DIAGNOSIS — Z1231 Encounter for screening mammogram for malignant neoplasm of breast: Secondary | ICD-10-CM | POA: Diagnosis not present

## 2019-05-01 DIAGNOSIS — Z6825 Body mass index (BMI) 25.0-25.9, adult: Secondary | ICD-10-CM | POA: Diagnosis not present

## 2019-05-01 DIAGNOSIS — Z124 Encounter for screening for malignant neoplasm of cervix: Secondary | ICD-10-CM | POA: Diagnosis not present

## 2019-05-15 DIAGNOSIS — L821 Other seborrheic keratosis: Secondary | ICD-10-CM | POA: Diagnosis not present

## 2019-05-15 DIAGNOSIS — L820 Inflamed seborrheic keratosis: Secondary | ICD-10-CM | POA: Diagnosis not present

## 2019-05-15 DIAGNOSIS — D225 Melanocytic nevi of trunk: Secondary | ICD-10-CM | POA: Diagnosis not present

## 2019-05-15 DIAGNOSIS — L814 Other melanin hyperpigmentation: Secondary | ICD-10-CM | POA: Diagnosis not present

## 2019-05-15 DIAGNOSIS — L578 Other skin changes due to chronic exposure to nonionizing radiation: Secondary | ICD-10-CM | POA: Diagnosis not present

## 2019-07-16 ENCOUNTER — Telehealth: Payer: Self-pay | Admitting: Cardiology

## 2019-07-16 NOTE — Telephone Encounter (Signed)
Patient calling about getting a surgical clearance form to [place]. Advised to have that office fax it to [place].

## 2019-11-06 DIAGNOSIS — Z1211 Encounter for screening for malignant neoplasm of colon: Secondary | ICD-10-CM | POA: Diagnosis not present

## 2019-11-06 DIAGNOSIS — E7800 Pure hypercholesterolemia, unspecified: Secondary | ICD-10-CM | POA: Diagnosis not present

## 2019-11-06 DIAGNOSIS — E559 Vitamin D deficiency, unspecified: Secondary | ICD-10-CM | POA: Diagnosis not present

## 2019-11-06 DIAGNOSIS — Z23 Encounter for immunization: Secondary | ICD-10-CM | POA: Diagnosis not present

## 2019-11-06 DIAGNOSIS — Z131 Encounter for screening for diabetes mellitus: Secondary | ICD-10-CM | POA: Diagnosis not present

## 2019-11-06 DIAGNOSIS — Z8616 Personal history of COVID-19: Secondary | ICD-10-CM | POA: Diagnosis not present

## 2019-11-06 DIAGNOSIS — I2510 Atherosclerotic heart disease of native coronary artery without angina pectoris: Secondary | ICD-10-CM | POA: Diagnosis not present

## 2019-11-06 DIAGNOSIS — Z0000 Encounter for general adult medical examination without abnormal findings: Secondary | ICD-10-CM | POA: Diagnosis not present

## 2019-11-06 DIAGNOSIS — E039 Hypothyroidism, unspecified: Secondary | ICD-10-CM | POA: Diagnosis not present

## 2019-11-07 ENCOUNTER — Encounter: Payer: Self-pay | Admitting: Cardiology

## 2019-11-07 ENCOUNTER — Other Ambulatory Visit: Payer: Self-pay

## 2019-11-07 ENCOUNTER — Ambulatory Visit: Payer: PPO | Admitting: Cardiology

## 2019-11-07 VITALS — BP 100/73 | HR 61 | Temp 97.3°F | Ht 61.5 in | Wt 135.2 lb

## 2019-11-07 DIAGNOSIS — Z01810 Encounter for preprocedural cardiovascular examination: Secondary | ICD-10-CM

## 2019-11-07 DIAGNOSIS — Z79899 Other long term (current) drug therapy: Secondary | ICD-10-CM

## 2019-11-07 DIAGNOSIS — I2510 Atherosclerotic heart disease of native coronary artery without angina pectoris: Secondary | ICD-10-CM | POA: Diagnosis not present

## 2019-11-07 DIAGNOSIS — Z7189 Other specified counseling: Secondary | ICD-10-CM

## 2019-11-07 DIAGNOSIS — Z8249 Family history of ischemic heart disease and other diseases of the circulatory system: Secondary | ICD-10-CM | POA: Diagnosis not present

## 2019-11-07 DIAGNOSIS — E7800 Pure hypercholesterolemia, unspecified: Secondary | ICD-10-CM | POA: Diagnosis not present

## 2019-11-07 NOTE — Progress Notes (Signed)
Cardiology Office Note:    Date:  11/07/2019   ID:  Monica Camacho, DOB 22-Aug-1950, MRN [id number]  PCP:  Leighton Ruff, MD  Cardiologist:  Buford Dresser, MD  Referring MD: Leighton Ruff, MD   Chief Complaint:  Follow up  History of Present Illness:    Monica Camacho is a 69 y.o. female who presents for follow up today. I intially met her 10/26/17 for evaluation of chest pain with a family history of CAD.  Today: Has an ache or throb "once in blue moon." Quick, no clear triggers. Reviewed red flag warning signs that need immediate medical attention, has not had any of these.  Planned surgery: cosmetic facial surgery, [facility] in [place]. Will need anesthesia. Happening in about a month.  Pertinent past cardiac history: Prior cardiac workup: CT cardiac showed nonobstructive CAD. On rosuvastatin History of valve disease: none History of PAD/CVA/TIA: none History of heart failure: none History of arrhythmia: none On anticoagulation: none History of hypertension none History of diabetes: none History of CKD: none History of OSA: none History of anesthesia complications: severe nausea in the past Current symptoms: no exertional symptoms, rare fleeting unrelated symptoms rarely Functional capacity: push mows her yard, walks stairs all day in her house  Denies chest pain, shortness of breath at rest or with normal exertion. No PND, orthopnea, LE edema or unexpected weight gain. No syncope or palpitations.  Discussed aspirin again today, she will consider post surgery. Working on eating healthy. Now retired. Due for repeat lipids, not fasting today.    Past Medical History:  Diagnosis Date  . Thyroid disease    Past Surgical History:  Procedure Laterality Date  . BACK SURGERY    . CESAREAN SECTION    . LAPAROSCOPIC BILATERAL SALPINGO OOPHERECTOMY       Current Meds  Medication Sig  . levothyroxine (SYNTHROID, LEVOTHROID) 112 MCG tablet Take 112 mcg  by mouth daily.  . Vitamin D, Ergocalciferol, 2000 units CAPS Take 2,000 Units by mouth daily.     Allergies:   Penicillins   Social History   Tobacco Use  . Smoking status: Former Research scientist (life sciences)  . Smokeless tobacco: Never Used  Vaping Use  . Vaping Use: Never used  Substance Use Topics  . Alcohol use: No  . Drug use: No     Family Hx: The patient's family history includes Heart attack (age of onset: 74) in her brother; Heart disease in her father, mother, and sister; Heart failure in her mother; Stroke in her father.  ROS:   Please see the history of present illness.    All other systems reviewed and are negative.   Prior CV studies:   The following studies were reviewed today: CT cardiac 12/31/2017 Aorta:  Normal size.  Mild diffuse calcifications.  No dissection.  Aortic Valve:  Trileaflet.  No calcifications.  Coronary Arteries:  Normal coronary origin.  Right dominance.  RCA is a large dominant artery that gives rise to PDA and PLVB. There is minimal plaque.  Left main is a large artery that gives rise to LAD a very small ramus intermedius and LCX arteries. Left main has minimal plaque in the distal portion with stenosis < 0-25%.  LAD is a large vessel that gives rise to a large diagonal artery. Proximal LAD has a long segment of mixed plaque with a focal stenosis 50-69%. Mid to distal LAD has minimal stenosis.  D1 is a large artery that has moderate mixed plaque in the proximal  segment with a focal stenosis of 50-69%.  LCX is a non-dominant artery that gives rise to one medium sized OM1 branch. There is mild diffuse plaque.  Other findings:  Normal pulmonary vein drainage into the left atrium.  Normal let atrial appendage without a thrombus.  Normal size of the pulmonary artery.  IMPRESSION: 1. Coronary calcium score of 153. This was 23 percentile for age and sex matched control.  2. Normal coronary origin with right dominance.  3. There  is moderate plaque in the proximal LAD and proximal to mid portion of a large 1. diagonal artery. Additional analysis with CT FFR will be submitted.  FFR: 1. Left Main:  No significant stenosis. 2. LAD: No significant stenosis. 3. LCX: No significant stenosis. 4. RCA: No significant stenosis.  IMPRESSION: 1.  CT FFR analysis didn't show any significant stenosis.   Labs/Other Tests and Data Reviewed:    EKG:  An ECG dated today was personally reviewed today and demonstrated:  NSR at 61 bpm  Recent Labs: 01/01/2019: ALT 10   Recent Lipid Panel Lab Results  Component Value Date/Time   CHOL 172 01/01/2019 08:43 AM   TRIG 72 01/01/2019 08:43 AM   HDL 62 01/01/2019 08:43 AM   CHOLHDL 2.8 01/01/2019 08:43 AM   LDLCALC 96 01/01/2019 08:43 AM    Wt Readings from Last 3 Encounters:  11/07/19 135 lb 3.2 oz (61.3 kg)  05/08/18 145 lb (65.8 kg)  02/07/18 142 lb (64.4 kg)     Objective:    Vital Signs:  BP 100/73   Pulse 61   Temp (!) 97.3 F (36.3 C)   Ht 5' 1.5" (1.562 m)   Wt 135 lb 3.2 oz (61.3 kg)   SpO2 99%   BMI 25.13 kg/m    GEN: Well nourished, well developed in no acute distress HEENT: Normal, moist mucous membranes NECK: No JVD CARDIAC: regular rhythm, normal S1 and S2, no rubs or gallops. No murmur. VASCULAR: Radial and DP pulses 2+ bilaterally. No carotid bruits RESPIRATORY:  Clear to auscultation without rales, wheezing or rhonchi  ABDOMEN: Soft, non-tender, non-distended MUSCULOSKELETAL:  Ambulates independently SKIN: Warm and dry, no edema NEUROLOGIC:  Alert and oriented x 3. No focal neuro deficits noted. PSYCHIATRIC:  Normal affect   ASSESSMENT & PLAN:    Preoperative cardiovascular risk assessment: Based on available date, patient's RCRI score = 0, which carries a 3.9% 30-day risk of death, MI, or cardiac arrest.  The patient is not currently having active cardiac symptoms, and they can achieve >4 METs of activity.  According to ACC/AHA  Guidelines, no further testing is needed.  Proceed with surgery at acceptable risk.  Our service is available as needed in the peri-operative period.     Nonobstructive CAD, found on CT cardiac: -LDL goal <70.  -rechecking fasting lipids -continue rosuvastatin -discussed aspirin again, she will start this once she is recovered from surgery -instructed on red flag warning signs, when to seek immediate medical attention.  CV risk factor counseling and secondary prevention: -recommend heart healthy/Mediterranean diet, with whole grains, fruits, vegetable, fish, lean meats, nuts, and olive oil. Limit salt. -recommend moderate walking, 3-5 times/week for 30-50 minutes each session. Aim for at least 150 minutes.week. Goal should be pace of 3 miles/hours, or walking 1.5 miles in 30 minutes -recommend avoidance of tobacco products. Avoid excess alcohol.  Follow up: 1 year or sooner as needed   Patient Instructions  Medication Instructions:  Your Physician recommend you continue on your  current medication as directed.     *If you need a refill on your cardiac medications before your next appointment, please call your pharmacy*   Lab Work: Your physician recommends that you return for lab work ( fasting lipid).  If you have labs (blood work) drawn today and your tests are completely normal, you will receive your results only by: Marland Kitchen MyChart Message (if you have MyChart) OR . A paper copy in the mail If you have any lab test that is abnormal or we need to change your treatment, we will call you to review the results.   Testing/Procedures: None ordered    Follow-Up: At [facility], you and your health needs are our priority.  As part of our continuing mission to provide you with exceptional heart care, we have created designated Provider Care Teams.  These Care Teams include your primary Cardiologist (physician) and Advanced Practice Providers (APPs -  Physician Assistants and Nurse  Practitioners) who all work together to provide you with the care you need, when you need it.  We recommend signing up for the patient portal called "MyChart".  Sign up information is provided on this After Visit Summary.  MyChart is used to connect with patients for Virtual Visits (Telemedicine).  Patients are able to view lab/test results, encounter notes, upcoming appointments, etc.  Non-urgent messages can be sent to your provider as well.   To learn more about what you can do with MyChart, go to [email or web address].    Your next appointment:   1 year(s)  The format for your next appointment:   In Person  Provider:   Buford Dresser, MD      Medication Adjustments/Labs and Tests Ordered: Current medicines are reviewed at length with the patient today.  Concerns regarding medicines are outlined above.  Tests Ordered: Orders Placed This Encounter  Procedures  . Lipid panel  . EKG 12-Lead   Medication Changes: No orders of the defined types were placed in this encounter.   Signed, Buford Dresser, MD  11/07/2019 12:31 PM    [facility]

## 2019-11-07 NOTE — Patient Instructions (Signed)
Medication Instructions:  Your Physician recommend you continue on your current medication as directed.     *If you need a refill on your cardiac medications before your next appointment, please call your pharmacy*   Lab Work: Your physician recommends that you return for lab work ( fasting lipid).  If you have labs (blood work) drawn today and your tests are completely normal, you will receive your results only by: Marland Kitchen MyChart Message (if you have MyChart) OR . A paper copy in the mail If you have any lab test that is abnormal or we need to change your treatment, we will call you to review the results.   Testing/Procedures: None ordered    Follow-Up: At [facility], you and your health needs are our priority.  As part of our continuing mission to provide you with exceptional heart care, we have created designated Provider Care Teams.  These Care Teams include your primary Cardiologist (physician) and Advanced Practice Providers (APPs -  Physician Assistants and Nurse Practitioners) who all work together to provide you with the care you need, when you need it.  We recommend signing up for the patient portal called "MyChart".  Sign up information is provided on this After Visit Summary.  MyChart is used to connect with patients for Virtual Visits (Telemedicine).  Patients are able to view lab/test results, encounter notes, upcoming appointments, etc.  Non-urgent messages can be sent to your provider as well.   To learn more about what you can do with MyChart, go to [email or web address].    Your next appointment:   1 year(s)  The format for your next appointment:   In Person  Provider:   Buford Dresser, MD

## 2019-11-08 DIAGNOSIS — E7800 Pure hypercholesterolemia, unspecified: Secondary | ICD-10-CM | POA: Diagnosis not present

## 2019-11-10 LAB — LIPID PANEL
Chol/HDL Ratio: 3 ratio (ref 0.0–4.4)
Cholesterol, Total: 161 mg/dL (ref 100–199)
HDL: 53 mg/dL (ref >39)
LDL Chol Calc (NIH): 94 mg/dL (ref 0–99)
Triglycerides: 70 mg/dL (ref 0–149)
VLDL Cholesterol Cal: 14 mg/dL (ref 5–40)

## 2019-11-20 ENCOUNTER — Telehealth: Payer: Self-pay | Admitting: Cardiology

## 2019-11-20 DIAGNOSIS — Z79899 Other long term (current) drug therapy: Secondary | ICD-10-CM

## 2019-11-20 NOTE — Telephone Encounter (Signed)
Returned call to patient regarding her surgical clearance. She  discussed the clearance with Dr. Harrell Gave at the office visit on 10/6 and needs the office visit and ECG faxed to the surgeon's office. I advised her to drop off the paperwork at the [facility] office with Dr. Judeth Cornfield name on it and that I will forward message to Dr. Harrell Gave and her primary nurse. Patient verbalized understanding and agreement and thanked me for the call.

## 2019-11-20 NOTE — Telephone Encounter (Signed)
Patient is needed surgical clearance. She needs to have a covid test, electrocardiogram and she has a patient referral for surgical clearance that needs to be filled out and signed and then faxed to the requesting physicians office. She has the paperwork in hand of all that needs to be done and she is wanting to bring it to the office. Please advise a good time to drop off this paperwork.

## 2019-11-21 MED ORDER — ROSUVASTATIN CALCIUM 40 MG PO TABS: 40.0000 mg | ORAL_TABLET | Freq: Every day | ORAL | 3 refills | Status: DC

## 2019-11-21 NOTE — Telephone Encounter (Signed)
Dr. Lurlean Nanny office did not receive imaging for EKG.  Sent print out via fax to office below.

## 2019-11-21 NOTE — Telephone Encounter (Signed)
Surgical clearance office note from 11/07/19 faxed to Dr. Barrett Shell office at [phone].  Pt also made aware of lab results and MD's recommendations. Pt verbalized understanding. New orders placed.   The LDL (bad cholesterol) is still above what we'd like to see. After she has her surgery, I'd like to have her restart aspirin and increase her rosuvastatin from 20 mg to 40 mg daily. We can then recheck lipids in about 3 mos.

## 2019-12-01 DIAGNOSIS — Z20822 Contact with and (suspected) exposure to covid-19: Secondary | ICD-10-CM | POA: Diagnosis not present

## 2020-01-24 ENCOUNTER — Other Ambulatory Visit: Payer: Self-pay | Admitting: Cardiology

## 2020-02-21 DIAGNOSIS — E039 Hypothyroidism, unspecified: Secondary | ICD-10-CM | POA: Diagnosis not present

## 2020-02-27 ENCOUNTER — Telehealth: Payer: Self-pay | Admitting: Cardiology

## 2020-02-27 NOTE — Telephone Encounter (Signed)
Per chart review, MD recommended to repeat lab work 3 months from Oct. 2021. Pt made aware and state she would stop by tomorrow morning.

## 2020-02-27 NOTE — Telephone Encounter (Signed)
Pt c/o medication issue:  1. Name of Medication: rosuvastatin (CRESTOR) 40 MG tablet(Expired)  2. How are you currently taking this medication (dosage and times per day)? As prescribed   3. Are you having a reaction (difficulty breathing--STAT)? No   4. What is your medication issue? Monica Camacho is calling stating she is needing a refill on this medication soon. She is wanting to know if Dr. Harrell Gave is wanting her to come in for labs to have her cholesterol checked prior to receiving a refill due to it not being checked since the increase in medication. I advised her there is a lipid request in the system, but that a message is being sent to confirm. Please advise .

## 2020-02-28 ENCOUNTER — Other Ambulatory Visit: Payer: Self-pay

## 2020-02-28 DIAGNOSIS — Z79899 Other long term (current) drug therapy: Secondary | ICD-10-CM | POA: Diagnosis not present

## 2020-02-28 LAB — LIPID PANEL
Chol/HDL Ratio: 2.1 ratio (ref 0.0–4.4)
Cholesterol, Total: 137 mg/dL (ref 100–199)
HDL: 65 mg/dL (ref >39)
LDL Chol Calc (NIH): 61 mg/dL (ref 0–99)
Triglycerides: 51 mg/dL (ref 0–149)
VLDL Cholesterol Cal: 11 mg/dL (ref 5–40)

## 2020-02-29 DIAGNOSIS — E049 Nontoxic goiter, unspecified: Secondary | ICD-10-CM | POA: Diagnosis not present

## 2020-02-29 DIAGNOSIS — E039 Hypothyroidism, unspecified: Secondary | ICD-10-CM | POA: Diagnosis not present

## 2020-03-10 DIAGNOSIS — R7303 Prediabetes: Secondary | ICD-10-CM | POA: Diagnosis not present

## 2020-07-23 DIAGNOSIS — R7303 Prediabetes: Secondary | ICD-10-CM | POA: Insufficient documentation

## 2020-07-23 DIAGNOSIS — E559 Vitamin D deficiency, unspecified: Secondary | ICD-10-CM | POA: Insufficient documentation

## 2020-10-31 DIAGNOSIS — L821 Other seborrheic keratosis: Secondary | ICD-10-CM | POA: Diagnosis not present

## 2020-10-31 DIAGNOSIS — Z85828 Personal history of other malignant neoplasm of skin: Secondary | ICD-10-CM | POA: Diagnosis not present

## 2020-10-31 DIAGNOSIS — L820 Inflamed seborrheic keratosis: Secondary | ICD-10-CM | POA: Diagnosis not present

## 2020-10-31 DIAGNOSIS — C44612 Basal cell carcinoma of skin of right upper limb, including shoulder: Secondary | ICD-10-CM | POA: Diagnosis not present

## 2020-11-14 DIAGNOSIS — R7303 Prediabetes: Secondary | ICD-10-CM | POA: Diagnosis not present

## 2020-11-14 DIAGNOSIS — Z1211 Encounter for screening for malignant neoplasm of colon: Secondary | ICD-10-CM | POA: Diagnosis not present

## 2020-11-14 DIAGNOSIS — E039 Hypothyroidism, unspecified: Secondary | ICD-10-CM | POA: Diagnosis not present

## 2020-11-14 DIAGNOSIS — I2510 Atherosclerotic heart disease of native coronary artery without angina pectoris: Secondary | ICD-10-CM | POA: Diagnosis not present

## 2020-11-14 DIAGNOSIS — E559 Vitamin D deficiency, unspecified: Secondary | ICD-10-CM | POA: Diagnosis not present

## 2020-11-14 DIAGNOSIS — M5442 Lumbago with sciatica, left side: Secondary | ICD-10-CM | POA: Diagnosis not present

## 2020-11-14 DIAGNOSIS — E7800 Pure hypercholesterolemia, unspecified: Secondary | ICD-10-CM | POA: Diagnosis not present

## 2020-11-14 DIAGNOSIS — G8929 Other chronic pain: Secondary | ICD-10-CM | POA: Diagnosis not present

## 2020-11-14 DIAGNOSIS — J069 Acute upper respiratory infection, unspecified: Secondary | ICD-10-CM | POA: Diagnosis not present

## 2020-11-29 ENCOUNTER — Other Ambulatory Visit: Payer: Self-pay | Admitting: Cardiology

## 2021-01-06 DIAGNOSIS — Z1231 Encounter for screening mammogram for malignant neoplasm of breast: Secondary | ICD-10-CM | POA: Diagnosis not present

## 2021-01-06 DIAGNOSIS — Z01419 Encounter for gynecological examination (general) (routine) without abnormal findings: Secondary | ICD-10-CM | POA: Diagnosis not present

## 2021-01-06 DIAGNOSIS — Z6825 Body mass index (BMI) 25.0-25.9, adult: Secondary | ICD-10-CM | POA: Diagnosis not present

## 2021-01-15 ENCOUNTER — Ambulatory Visit (HOSPITAL_BASED_OUTPATIENT_CLINIC_OR_DEPARTMENT_OTHER): Payer: PPO | Admitting: Cardiology

## 2021-01-25 ENCOUNTER — Other Ambulatory Visit: Payer: Self-pay | Admitting: Cardiology

## 2021-02-10 ENCOUNTER — Telehealth (HOSPITAL_BASED_OUTPATIENT_CLINIC_OR_DEPARTMENT_OTHER): Payer: Self-pay | Admitting: Cardiology

## 2021-02-10 MED ORDER — ROSUVASTATIN CALCIUM 40 MG PO TABS: 40.0000 mg | ORAL_TABLET | Freq: Every day | ORAL | 0 refills | Status: DC

## 2021-02-10 NOTE — Telephone Encounter (Signed)
Received fax from [facility] requesting refills for Rosuvastatin 40 mg. Rx request sent to pharmacy for 90 days with 0 refills. Must keep appointment on 02/16/21 with Dr. Harrell Gave for refills.

## 2021-02-16 ENCOUNTER — Other Ambulatory Visit: Payer: Self-pay

## 2021-02-16 ENCOUNTER — Encounter (HOSPITAL_BASED_OUTPATIENT_CLINIC_OR_DEPARTMENT_OTHER): Payer: Self-pay | Admitting: Cardiology

## 2021-02-16 ENCOUNTER — Ambulatory Visit (HOSPITAL_BASED_OUTPATIENT_CLINIC_OR_DEPARTMENT_OTHER): Payer: PPO | Admitting: Cardiology

## 2021-02-16 VITALS — BP 102/70 | HR 69 | Ht 61.0 in | Wt 137.3 lb

## 2021-02-16 DIAGNOSIS — I2510 Atherosclerotic heart disease of native coronary artery without angina pectoris: Secondary | ICD-10-CM

## 2021-02-16 DIAGNOSIS — E7800 Pure hypercholesterolemia, unspecified: Secondary | ICD-10-CM

## 2021-02-16 DIAGNOSIS — Z7189 Other specified counseling: Secondary | ICD-10-CM | POA: Diagnosis not present

## 2021-02-16 DIAGNOSIS — R0789 Other chest pain: Secondary | ICD-10-CM | POA: Diagnosis not present

## 2021-02-16 DIAGNOSIS — Z8249 Family history of ischemic heart disease and other diseases of the circulatory system: Secondary | ICD-10-CM

## 2021-02-16 LAB — LIPID PANEL
Chol/HDL Ratio: 2.1 ratio (ref 0.0–4.4)
Cholesterol, Total: 142 mg/dL (ref 100–199)
HDL: 69 mg/dL (ref >39)
LDL Chol Calc (NIH): 61 mg/dL (ref 0–99)
Triglycerides: 58 mg/dL (ref 0–149)
VLDL Cholesterol Cal: 12 mg/dL (ref 5–40)

## 2021-02-16 NOTE — Progress Notes (Signed)
Cardiology Office Note:    Date:  02/16/2021   ID:  Monica Camacho, DOB 02-14-1950, MRN [id number]  PCP:  Leighton Ruff, MD (Inactive)  Cardiologist:  Buford Dresser, MD  Referring MD: Leighton Ruff, MD   Chief Complaint:  Follow up  History of Present Illness:    Monica Camacho is a 71 y.o. female with nonobstructive CAD and hypercholesterolemia who presents for follow up today. I intially met her 10/26/17 for evaluation of chest pain with a family history of CAD.  Today: Overall, she is feeling good. However, once in a while she will feel chest pain (maybe once every 2 weeks). Her chest pain occurs randomly, such as while watching TV. She describes the pain as a "slight ache" localized in the same area each time (left pectoral region). The duration of her pain is typically a few minutes. She has tried taking aspirin which seems to help. Reviewed red flag warning signs that need immediate medical attention, has not had any of these.  She denies any palpitations, or shortness of breath. No lightheadedness, headaches, syncope, orthopnea, PND, lower extremity edema or exertional symptoms. She is fasting this morning.  Of note, she is scheduled for a colonoscopy soon.  Past Medical History:  Diagnosis Date   Thyroid disease    Past Surgical History:  Procedure Laterality Date   BACK SURGERY     CESAREAN SECTION     LAPAROSCOPIC BILATERAL SALPINGO OOPHERECTOMY       Current Meds  Medication Sig   levothyroxine (SYNTHROID, LEVOTHROID) 112 MCG tablet Take 112 mcg by mouth daily.   rosuvastatin (CRESTOR) 40 MG tablet Take 1 tablet (40 mg total) by mouth daily. Must keep upcoming appointment with Dr. Harrell Gave for refills.   Vitamin D, Ergocalciferol, 2000 units CAPS Take 2,000 Units by mouth daily.     Allergies:   Penicillins   Social History   Tobacco Use   Smoking status: Former   Smokeless tobacco: Never  Scientific laboratory technician Use: Never used  Substance  Use Topics   Alcohol use: No   Drug use: No     Family Hx: The patient's family history includes Heart attack (age of onset: 8) in her brother; Heart disease in her father, mother, and sister; Heart failure in her mother; Stroke in her father.  ROS:   Please see the history of present illness.    (+) Chest pain All other systems reviewed and are negative.   Prior CV studies:   The following studies were reviewed today:  CT cardiac 12/31/2017 Aorta:  Normal size.  Mild diffuse calcifications.  No dissection.   Aortic Valve:  Trileaflet.  No calcifications.   Coronary Arteries:  Normal coronary origin.  Right dominance.   RCA is a large dominant artery that gives rise to PDA and PLVB. There is minimal plaque.   Left main is a large artery that gives rise to LAD a very small ramus intermedius and LCX arteries. Left main has minimal plaque in the distal portion with stenosis < 0-25%.   LAD is a large vessel that gives rise to a large diagonal artery. Proximal LAD has a long segment of mixed plaque with a focal stenosis 50-69%. Mid to distal LAD has minimal stenosis.   D1 is a large artery that has moderate mixed plaque in the proximal segment with a focal stenosis of 50-69%.   LCX is a non-dominant artery that gives rise to one medium sized OM1 branch. There  is mild diffuse plaque.   Other findings:   Normal pulmonary vein drainage into the left atrium.   Normal let atrial appendage without a thrombus.   Normal size of the pulmonary artery.   IMPRESSION: 1. Coronary calcium score of 153. This was 65 percentile for age and sex matched control.   2. Normal coronary origin with right dominance.   3. There is moderate plaque in the proximal LAD and proximal to mid portion of a large 1. diagonal artery. Additional analysis with CT FFR will be submitted.  FFR: 1. Left Main:  No significant stenosis.  2. LAD: No significant stenosis. 3. LCX: No significant  stenosis. 4. RCA: No significant stenosis.   IMPRESSION: 1.  CT FFR analysis didn't show any significant stenosis.   Labs/Other Tests and Data Reviewed:    EKG:   EKG is personally reviewed. 02/16/2021: NSR at 69 bpm 11/07/2019: NSR at 61 bpm.  Recent Labs: No results found for requested labs within last 8760 hours.   Recent Lipid Panel Lab Results  Component Value Date/Time   CHOL 137 02/28/2020 10:43 AM   TRIG 51 02/28/2020 10:43 AM   HDL 65 02/28/2020 10:43 AM   CHOLHDL 2.1 02/28/2020 10:43 AM   LDLCALC 61 02/28/2020 10:43 AM    Wt Readings from Last 3 Encounters:  02/16/21 137 lb 4.8 oz (62.3 kg)  11/07/19 135 lb 3.2 oz (61.3 kg)  05/08/18 145 lb (65.8 kg)     Objective:    Vital Signs:  BP 102/70 (BP Location: Left Arm, Patient Position: Sitting, Cuff Size: Normal)    Pulse 69    Ht 5' 1"  (1.549 m)    Wt 137 lb 4.8 oz (62.3 kg)    BMI 25.94 kg/m    GEN: Well nourished, well developed in no acute distress HEENT: Normal, moist mucous membranes NECK: No JVD CARDIAC: regular rhythm, normal S1 and S2, no rubs or gallops. No murmur. VASCULAR: Radial and DP pulses 2+ bilaterally. No carotid bruits RESPIRATORY:  Clear to auscultation without rales, wheezing or rhonchi  ABDOMEN: Soft, non-tender, non-distended MUSCULOSKELETAL:  Ambulates independently. Muscle knot in upper left pectoral region SKIN: Warm and dry, no edema NEUROLOGIC:  Alert and oriented x 3. No focal neuro deficits noted. PSYCHIATRIC:  Normal affect   ASSESSMENT & PLAN:    1. Nonocclusive coronary atherosclerosis of native coronary artery   2. Pure hypercholesterolemia   3. Family history of heart disease   4. Cardiac risk counseling   5. Counseling on health promotion and disease prevention   6. Atypical chest pain     Atypical chest pain: -reviewed, no red flag signs, she will continue to monitor  Nonobstructive CAD, found on CT cardiac Hypercholesterolemia Family history of heart  disease -LDL goal <70.  -rechecking fasting lipids today -continue rosuvastatin -discussed aspirin again, she will consider -instructed on red flag warning signs, when to seek immediate medical attention.  CV risk factor counseling and secondary prevention: -recommend heart healthy/Mediterranean diet, with whole grains, fruits, vegetable, fish, lean meats, nuts, and olive oil. Limit salt. -recommend moderate walking, 3-5 times/week for 30-50 minutes each session. Aim for at least 150 minutes.week. Goal should be pace of 3 miles/hours, or walking 1.5 miles in 30 minutes -recommend avoidance of tobacco products. Avoid excess alcohol.  Follow up: 1 year or sooner as needed   Patient Instructions  Medication Instructions:  Your Physician recommend you continue on your current medication as directed.    *If you need  a refill on your cardiac medications before your next appointment, please call your pharmacy*   Lab Work: Your provider has recommended lab work today (Fasting Lipid). Please have this collected at [facility] at [place]. The lab is open 8:00 am - 4:30 pm. Please avoid 12:00p - 1:00p for lunch hour. You do not need an appointment. Please go to [address] [place], [place] [address]. This is in the Primary Care office on the 3rd floor, let them know you are there for blood work and they will direct you to the lab.    Testing/Procedures: None ordered today   Follow-Up: At [facility], you and your health needs are our priority.  As part of our continuing mission to provide you with exceptional heart care, we have created designated Provider Care Teams.  These Care Teams include your primary Cardiologist (physician) and Advanced Practice Providers (APPs -  Physician Assistants and Nurse Practitioners) who all work together to provide you with the care you need, when you need it.  We recommend signing up for the patient portal called "MyChart".   Sign up information is provided on this After Visit Summary.  MyChart is used to connect with patients for Virtual Visits (Telemedicine).  Patients are able to view lab/test results, encounter notes, upcoming appointments, etc.  Non-urgent messages can be sent to your provider as well.   To learn more about what you can do with MyChart, go to [email or web address].    Your next appointment:   1 year(s)  The format for your next appointment:   In Person  Provider:   Buford Dresser, MD       Medication Adjustments/Labs and Tests Ordered: Current medicines are reviewed at length with the patient today.  Concerns regarding medicines are outlined above.   Tests Ordered: Orders Placed This Encounter  Procedures   Lipid panel   EKG 12-Lead   Medication Changes: No orders of the defined types were placed in this encounter.  I,Mathew Stumpf,acting as a Education administrator for [employer], MD.,have documented all relevant documentation on the behalf of Buford Dresser, MD,as directed by  Buford Dresser, MD while in the presence of Buford Dresser, MD.  I, Buford Dresser, MD, have reviewed all documentation for this visit. The documentation on 02/16/21 for the exam, diagnosis, procedures, and orders are all accurate and complete.   Signed, Buford Dresser, MD  02/16/2021 12:25 PM    [place]

## 2021-02-16 NOTE — Patient Instructions (Signed)
Medication Instructions:  Your Physician recommend you continue on your current medication as directed.    *If you need a refill on your cardiac medications before your next appointment, please call your pharmacy*   Lab Work: Your provider has recommended lab work today (Fasting Lipid). Please have this collected at [facility] at [place]. The lab is open 8:00 am - 4:30 pm. Please avoid 12:00p - 1:00p for lunch hour. You do not need an appointment. Please go to [address] [place], [place] [address]. This is in the Primary Care office on the 3rd floor, let them know you are there for blood work and they will direct you to the lab.    Testing/Procedures: None ordered today   Follow-Up: At [facility], you and your health needs are our priority.  As part of our continuing mission to provide you with exceptional heart care, we have created designated Provider Care Teams.  These Care Teams include your primary Cardiologist (physician) and Advanced Practice Providers (APPs -  Physician Assistants and Nurse Practitioners) who all work together to provide you with the care you need, when you need it.  We recommend signing up for the patient portal called "MyChart".  Sign up information is provided on this After Visit Summary.  MyChart is used to connect with patients for Virtual Visits (Telemedicine).  Patients are able to view lab/test results, encounter notes, upcoming appointments, etc.  Non-urgent messages can be sent to your provider as well.   To learn more about what you can do with MyChart, go to [email or web address].    Your next appointment:   1 year(s)  The format for your next appointment:   In Person  Provider:   Buford Dresser, MD

## 2021-02-20 DIAGNOSIS — E039 Hypothyroidism, unspecified: Secondary | ICD-10-CM | POA: Diagnosis not present

## 2021-02-27 DIAGNOSIS — E049 Nontoxic goiter, unspecified: Secondary | ICD-10-CM | POA: Diagnosis not present

## 2021-02-27 DIAGNOSIS — M8589 Other specified disorders of bone density and structure, multiple sites: Secondary | ICD-10-CM | POA: Diagnosis not present

## 2021-02-27 DIAGNOSIS — E039 Hypothyroidism, unspecified: Secondary | ICD-10-CM | POA: Diagnosis not present

## 2021-02-27 DIAGNOSIS — Z780 Asymptomatic menopausal state: Secondary | ICD-10-CM | POA: Diagnosis not present

## 2021-03-10 DIAGNOSIS — M5136 Other intervertebral disc degeneration, lumbar region: Secondary | ICD-10-CM | POA: Diagnosis not present

## 2021-03-10 DIAGNOSIS — M545 Low back pain, unspecified: Secondary | ICD-10-CM | POA: Diagnosis not present

## 2021-04-04 IMAGING — US US THYROID
1 series · 13 of 25 positions shown · non-contrast
Comparison: 02/28/2017;

CLINICAL DATA: Prior ultrasound follow-up. History of previous
right-sided thyroid nodule fine-needle aspiration performed in 3477.

EXAM:
THYROID ULTRASOUND
TECHNIQUE: Ultrasound examination of the thyroid gland and adjacent soft
tissues was performed.

[Series 1: us thyroid · 0.08mm/px · 40 acquisitions, 13 frames shown]
[im 1/40]
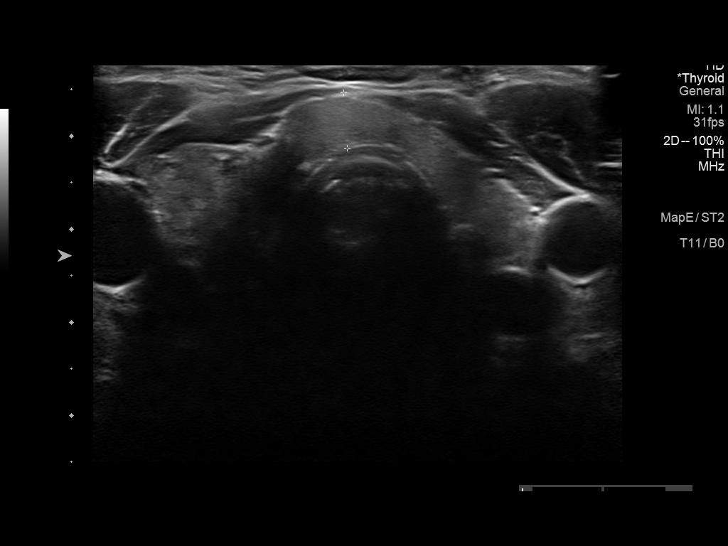
[im 4/40]
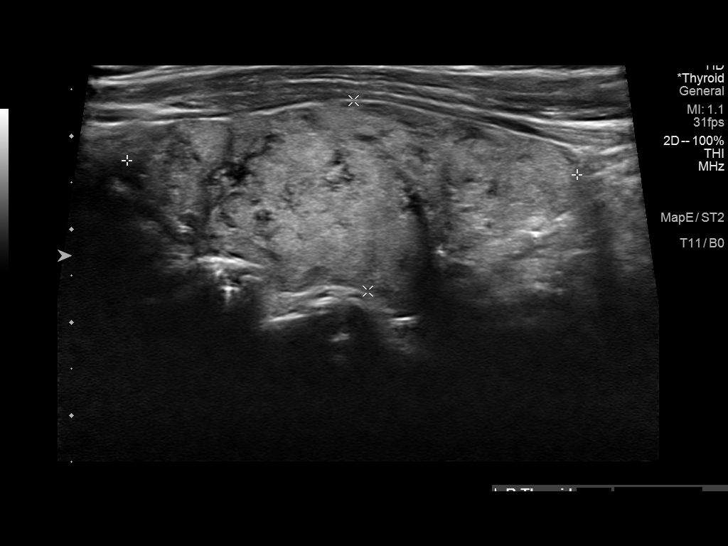
[im 7/40]
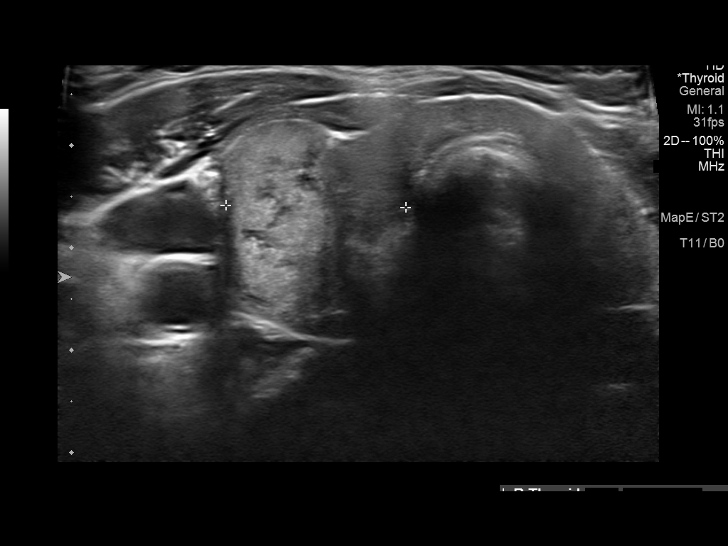
[im 10/40]
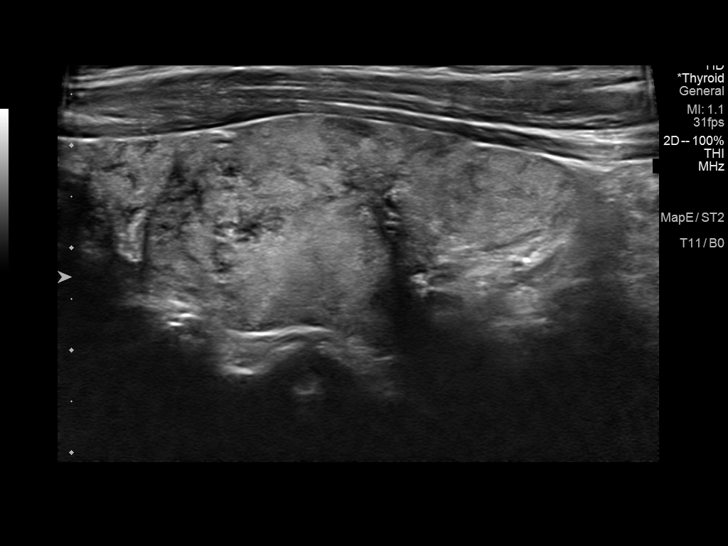
[im 14/40]
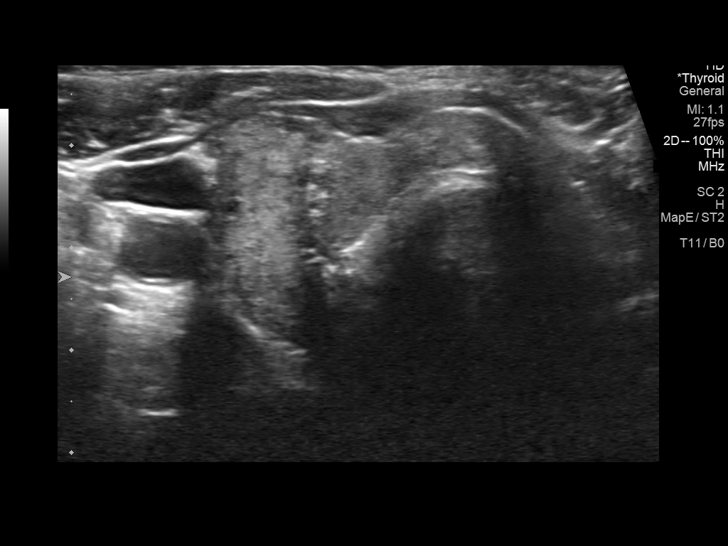
[im 17/40]
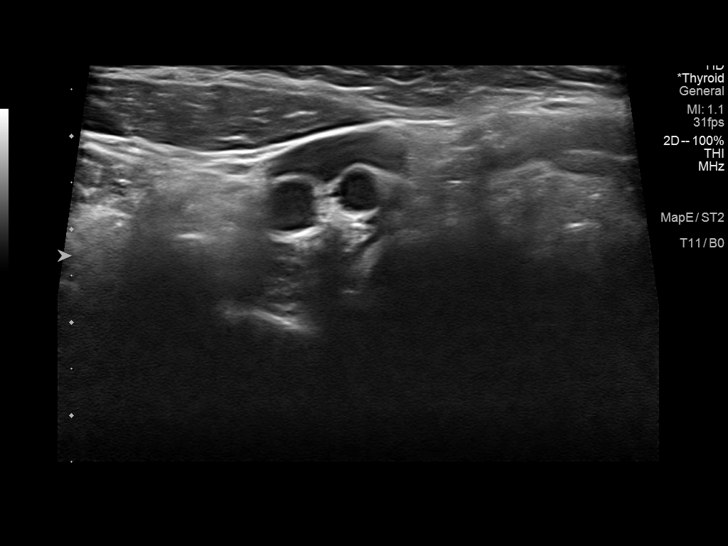
[im 20/40]
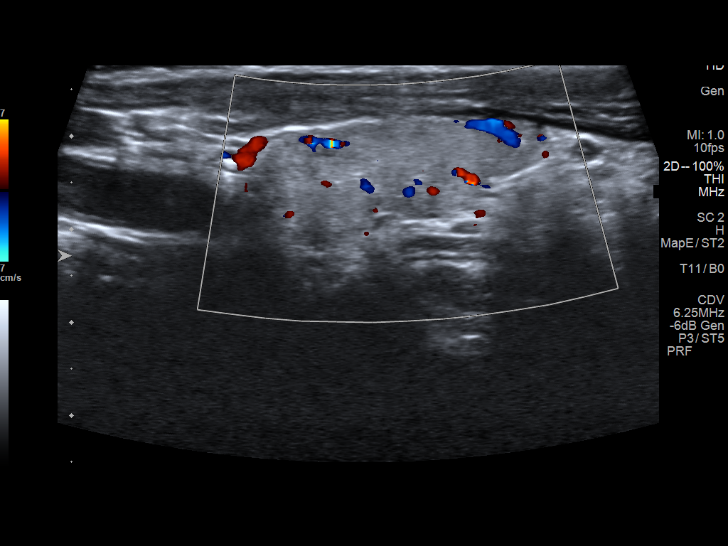
[im 23/40]
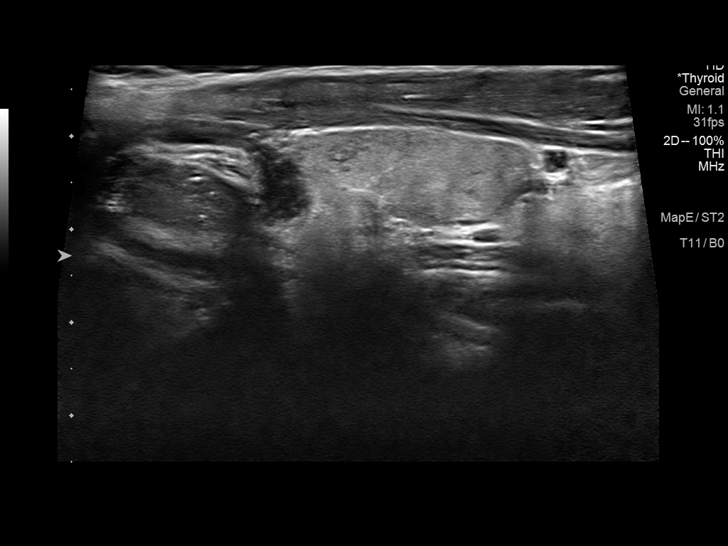
[im 27/40]
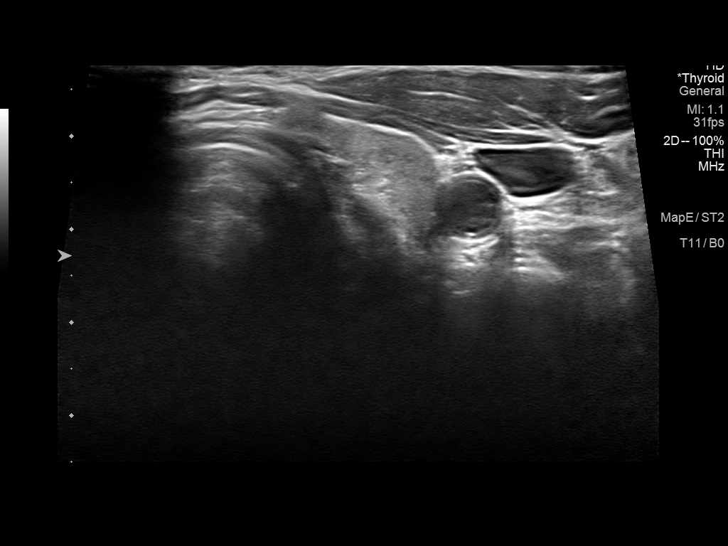
[im 30/40]
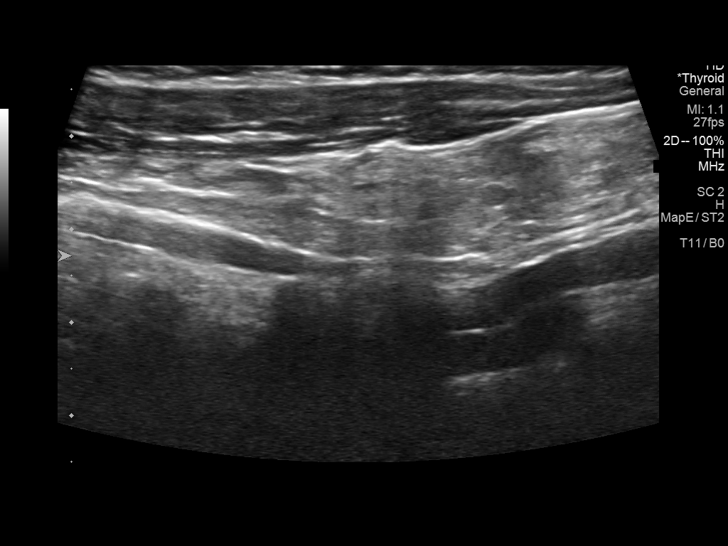
[im 33/40]
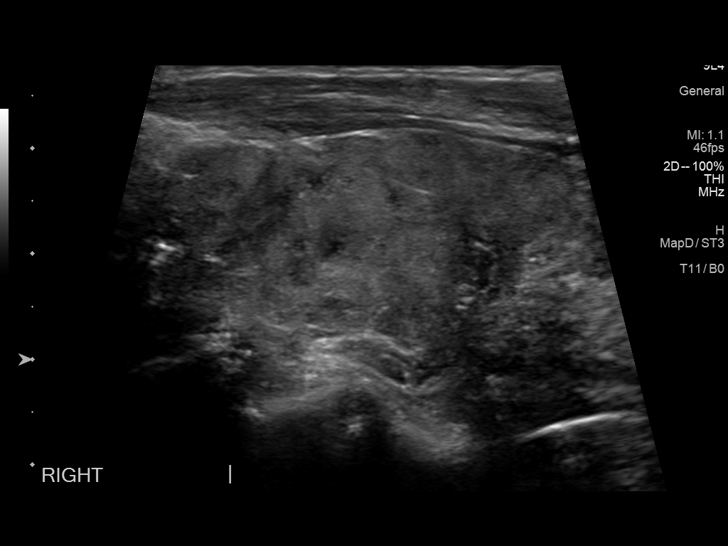
[im 36/40]
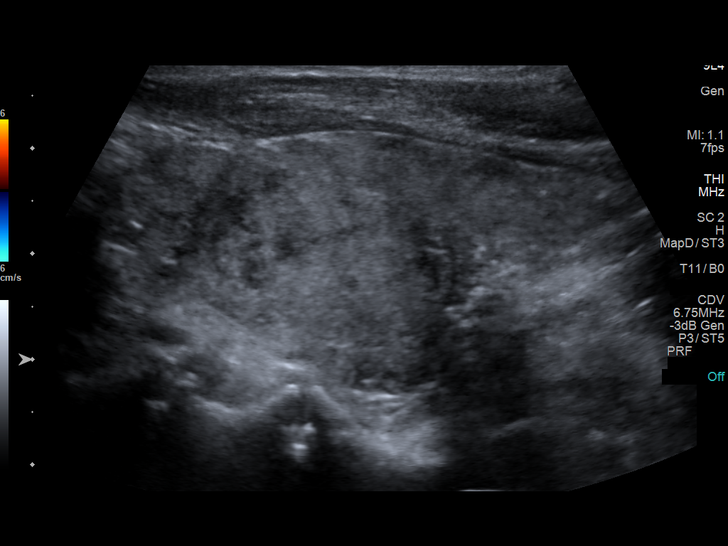
[im 40/40]
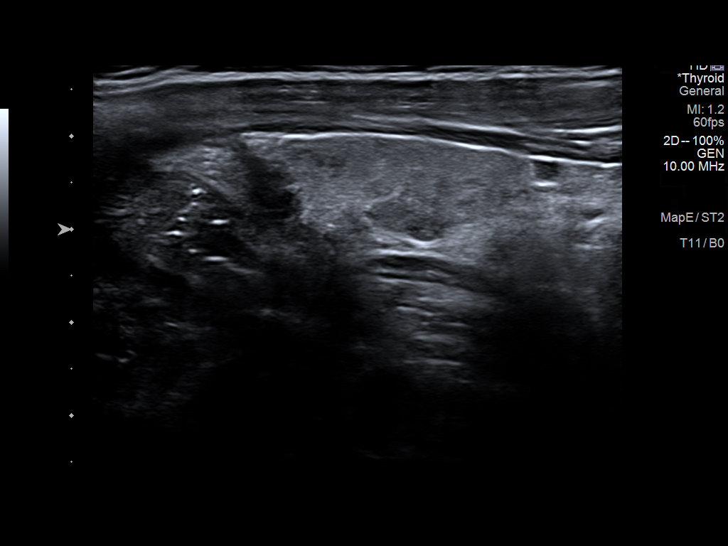

[13 of 25 positions shown; findings below may reference images not displayed]

11/01/2013; 08/14/2010; 08/19/2009;
right-sided thyroid nodule fine-needle aspiration-09/10/2009
FINDINGS: Parenchymal Echotexture: Markedly heterogenous apparent asymmetric
hyperemia involving the right lobe of the thyroid (image 35.

Isthmus: Normal in size measures 0.6 cm in diameter, unchanged

Right lobe: Normal in size measuring 4.8 x 2.1 x 1.8 cm, unchanged,
previously, 5.4 x 2.2 x 2.0 cm

Left lobe: Slightly atrophic in size measuring 2.7 x 1.2 x 1.2 cm,
unchanged, previously, 2.9 x 1.1 x 1.3 cm

_________________________________________________________

Estimated total number of nodules >/= 1 cm: 0

Number of spongiform nodules >/=  2 cm not described below (TR1): 0

Number of mixed cystic and solid nodules >/= 1.5 cm not described
below (TR2): 0

_________________________________________________________

Redemonstrated marked asymmetric heterogeneity of the right lobe of
the thyroid in comparison to the left without definitive nodule or
mass. No definitive nodules are seen within either the right and
lobes of the thyroid.
IMPRESSION: 1. No worrisome new or enlarging thyroid nodules.
2. Redemonstrated marked asymmetric heterogeneity of the right lobe
of the thyroid in comparison to the left without definitive nodule
or mass, similar to 6686 examination.

## 2021-05-29 DIAGNOSIS — Z8371 Family history of colonic polyps: Secondary | ICD-10-CM | POA: Diagnosis not present

## 2021-05-29 DIAGNOSIS — K648 Other hemorrhoids: Secondary | ICD-10-CM | POA: Diagnosis not present

## 2021-05-29 DIAGNOSIS — Z1211 Encounter for screening for malignant neoplasm of colon: Secondary | ICD-10-CM | POA: Diagnosis not present

## 2021-11-12 DIAGNOSIS — L821 Other seborrheic keratosis: Secondary | ICD-10-CM | POA: Diagnosis not present

## 2021-11-12 DIAGNOSIS — Z85828 Personal history of other malignant neoplasm of skin: Secondary | ICD-10-CM | POA: Diagnosis not present

## 2021-11-17 DIAGNOSIS — R140 Abdominal distension (gaseous): Secondary | ICD-10-CM | POA: Diagnosis not present

## 2021-11-17 DIAGNOSIS — E039 Hypothyroidism, unspecified: Secondary | ICD-10-CM | POA: Diagnosis not present

## 2021-11-17 DIAGNOSIS — R7303 Prediabetes: Secondary | ICD-10-CM | POA: Diagnosis not present

## 2021-11-17 DIAGNOSIS — R110 Nausea: Secondary | ICD-10-CM | POA: Diagnosis not present

## 2021-11-17 DIAGNOSIS — E7800 Pure hypercholesterolemia, unspecified: Secondary | ICD-10-CM | POA: Diagnosis not present

## 2022-02-08 ENCOUNTER — Other Ambulatory Visit (HOSPITAL_BASED_OUTPATIENT_CLINIC_OR_DEPARTMENT_OTHER): Payer: Self-pay | Admitting: Cardiology

## 2022-02-08 NOTE — Telephone Encounter (Signed)
Rx(s) sent to pharmacy electronically.  

## 2022-03-01 DIAGNOSIS — M8580 Other specified disorders of bone density and structure, unspecified site: Secondary | ICD-10-CM | POA: Diagnosis not present

## 2022-03-01 DIAGNOSIS — E039 Hypothyroidism, unspecified: Secondary | ICD-10-CM | POA: Diagnosis not present

## 2022-03-01 DIAGNOSIS — E049 Nontoxic goiter, unspecified: Secondary | ICD-10-CM | POA: Diagnosis not present

## 2022-03-04 ENCOUNTER — Other Ambulatory Visit (HOSPITAL_BASED_OUTPATIENT_CLINIC_OR_DEPARTMENT_OTHER): Payer: Self-pay | Admitting: Cardiology

## 2022-03-04 NOTE — Telephone Encounter (Signed)
Rx request sent to pharmacy.  

## 2022-03-25 ENCOUNTER — Encounter (HOSPITAL_BASED_OUTPATIENT_CLINIC_OR_DEPARTMENT_OTHER): Payer: Self-pay | Admitting: Cardiology

## 2022-03-25 ENCOUNTER — Ambulatory Visit (HOSPITAL_BASED_OUTPATIENT_CLINIC_OR_DEPARTMENT_OTHER): Payer: PPO | Admitting: Cardiology

## 2022-03-25 VITALS — BP 100/80 | HR 65 | Ht 61.0 in | Wt 134.0 lb

## 2022-03-25 DIAGNOSIS — I2510 Atherosclerotic heart disease of native coronary artery without angina pectoris: Secondary | ICD-10-CM | POA: Diagnosis not present

## 2022-03-25 DIAGNOSIS — E7800 Pure hypercholesterolemia, unspecified: Secondary | ICD-10-CM

## 2022-03-25 DIAGNOSIS — Z7189 Other specified counseling: Secondary | ICD-10-CM | POA: Diagnosis not present

## 2022-03-25 DIAGNOSIS — Z8249 Family history of ischemic heart disease and other diseases of the circulatory system: Secondary | ICD-10-CM | POA: Diagnosis not present

## 2022-03-25 NOTE — Patient Instructions (Signed)
Medication Instructions:  No change *If you need a refill on your cardiac medications before your next appointment, please call your pharmacy*   Lab Work: If you don't have cholesterol drawn at your upcoming wellness visit, let us know If you have labs (blood work) drawn today and your tests are completely normal, you will receive your results only by: [place] (if you have MyChart) OR A paper copy in the mail If you have any lab test that is abnormal or we need to change your treatment, we will call you to review the results.   Testing/Procedures: None   Follow-Up: At [facility], you and your health needs are our priority.  As part of our continuing mission to provide you with exceptional heart care, we have created designated Provider Care Teams.  These Care Teams include your primary Cardiologist (physician) and Advanced Practice Providers (APPs -  Physician Assistants and Nurse Practitioners) who all work together to provide you with the care you need, when you need it.  We recommend signing up for the patient portal called "MyChart".  Sign up information is provided on this After Visit Summary.  MyChart is used to connect with patients for Virtual Visits (Telemedicine).  Patients are able to view lab/test results, encounter notes, upcoming appointments, etc.  Non-urgent messages can be sent to your provider as well.   To learn more about what you can do with MyChart, go to [email or web address].    Your next appointment:   1 year(s)  Provider:   Buford Dresser, MD    Other Instructions If you don't have your cholesterol checked with Dr. Sabra Heck, let me know and we will order it.

## 2022-03-25 NOTE — Progress Notes (Signed)
Cardiology Office Note:    Date:  03/25/2022   ID:  Monica Camacho, DOB [birth year], MRN [id number]  PCP:  Kathyrn Lass, MD  Cardiologist:  Buford Dresser, MD  Chief Complaint:  Follow up  History of Present Illness:    Monica Camacho is a 72 y.o. female with nonobstructive CAD and hypercholesterolemia who presents for follow up today. I intially met her 10/26/17 for evaluation of chest pain with a family history of CAD.  Last visit she still had occasional episodes of chest pain (once every two weeks) which occur randomly. She describes the pain as a "slight ache" localized in the left pectoral region lasting a few minutes. Taking aspirin seems to help with the pain.  Today, the patient states that she has been doing well. She hasn't had any episodes of chest pain in a long time. She has had no issues with breathing or syncope. She doesn't feel very limited in activity aside from a recent foot injury from a fall. She has been having some leg pain from her statin, which is well managed by tylenol which she is able to tolerate. She has had no issues with bruising or bleeding.   She denies any palpitations, chest pain, shortness of breath, or peripheral edema. No lightheadedness, headaches, syncope, orthopnea, or PND.  Past Medical History:  Diagnosis Date   Thyroid disease    Past Surgical History:  Procedure Laterality Date   BACK SURGERY     CESAREAN SECTION     LAPAROSCOPIC BILATERAL SALPINGO OOPHERECTOMY       Current Meds  Medication Sig   levothyroxine (SYNTHROID, LEVOTHROID) 112 MCG tablet Take 112 mcg by mouth daily.   rosuvastatin (CRESTOR) 40 MG tablet Take 1 tablet (40 mg total) by mouth daily. Please keep your upcoming appointment for refills.   Vitamin D, Ergocalciferol, 2000 units CAPS Take 2,000 Units by mouth daily.     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  ROS:   Please see the history of present illness.    (+) Leg Pain All other systems reviewed and are negative.   Prior CV studies:   The following studies were reviewed today:  CT cardiac 12/31/2017 Aorta:  Normal size.  Mild diffuse calcifications.  No dissection.   Aortic Valve:  Trileaflet.  No calcifications.   Coronary Arteries:  Normal coronary origin.  Right dominance.   RCA is a large dominant artery that gives rise to PDA and PLVB. There is minimal plaque.   Left main is a large artery that gives rise to LAD a very small ramus intermedius and LCX arteries. Left main has minimal plaque in the distal portion with stenosis < 0-25%.   LAD is a large vessel that gives rise to a large diagonal artery. Proximal LAD has a long segment of mixed plaque with a focal stenosis 50-69%. Mid to distal LAD has minimal stenosis.   D1 is a large artery that has moderate mixed plaque in the proximal segment with a focal stenosis of 50-69%.   LCX is  a non-dominant artery that gives rise to one medium sized OM1 branch. There is mild diffuse plaque.   Other findings:   Normal pulmonary vein drainage into the left atrium.   Normal let atrial appendage without a thrombus.   Normal size of the pulmonary artery.   IMPRESSION: 1. Coronary calcium score of 153. This was 54 percentile for age and sex matched control.   2. Normal coronary origin with right dominance.   3. There is moderate plaque in the proximal LAD and proximal to mid portion of a large 1. diagonal artery. Additional analysis with CT FFR will be submitted.  FFR: 1. Left Main:  No significant stenosis.  2. LAD: No significant stenosis. 3.  LCX: No significant stenosis. 4. RCA: No significant stenosis.   IMPRESSION: 1.  CT FFR analysis didn't show any significant stenosis.   Labs/Other Tests and Data Reviewed:    EKG:   EKG is personally reviewed. 03/25/2022: NSR, 1st degree av block at 65 bpm. 02/16/2021: NSR at 69 bpm 11/07/2019: NSR at 61 bpm.  Recent Labs: No results found for requested labs within last 365 days.   Recent Lipid Panel Lab Results  Component Value Date/Time   CHOL 142 02/16/2021 10:57 AM   TRIG 58 02/16/2021 10:57 AM   HDL 69 02/16/2021 10:57 AM   CHOLHDL 2.1 02/16/2021 10:57 AM   LDLCALC 61 02/16/2021 10:57 AM    Wt Readings from Last 3 Encounters:  03/25/22 134 lb (60.8 kg)  02/16/21 137 lb 4.8 oz (62.3 kg)  11/07/19 135 lb 3.2 oz (61.3 kg)     Objective:    Vital Signs:  BP 100/80 (BP Location: Left Arm, Patient Position: Sitting, Cuff Size: Normal)   Pulse 65   Ht 5' 1"$  (1.549 m)   Wt 134 lb (60.8 kg)   SpO2 98%   BMI 25.32 kg/m    GEN: Well nourished, well developed in no acute distress HEENT: Normal, moist mucous membranes NECK: No JVD CARDIAC: regular rhythm, normal S1 and S2, no rubs or gallops. No murmur. VASCULAR: Radial and DP pulses 2+ bilaterally. No carotid bruits RESPIRATORY:  Clear to auscultation without rales, wheezing or rhonchi  ABDOMEN: Soft, non-tender, non-distended MUSCULOSKELETAL:  Ambulates independently. Muscle knot in upper left pectoral region SKIN: Warm and dry, no edema NEUROLOGIC:  Alert and oriented x 3. No focal neuro deficits noted. PSYCHIATRIC:  Normal affect   ASSESSMENT & PLAN:    1. Nonocclusive coronary atherosclerosis of native coronary artery   2. Pure hypercholesterolemia   3. Family history of heart disease   4. Cardiac risk counseling   5. Counseling on health promotion and disease prevention      Atypical chest pain: -resolved  Nonobstructive CAD, found on CT cardiac Hypercholesterolemia Family history of heart  disease -LDL goal <70, last 61 02/2021. She will likely have labs at her upcoming wellness visit, but if not she will contact me and we will recheck lipids -continue rosuvastatin -we have discussed aspirin. -instructed on red flag warning signs, when to seek immediate medical attention.  CV risk factor counseling and secondary prevention: -recommend heart healthy/Mediterranean diet, with whole grains, fruits, vegetable, fish, lean meats, nuts, and olive oil. Limit salt. -recommend moderate walking, 3-5 times/week for 30-50 minutes each session. Aim for at least 150 minutes.week. Goal should be pace of 3 miles/hours, or walking 1.5 miles in 30 minutes -recommend avoidance of tobacco products. Avoid excess alcohol.  Follow up: 1 year   Patient Instructions  Medication Instructions:  No change *If you need a refill on your cardiac medications before your next appointment, please call your pharmacy*   Lab Work: If you don't have cholesterol drawn at your upcoming wellness visit, let us know If you have labs (blood work) drawn today and your tests are completely normal, you will receive your results only by: [place] (if you have MyChart) OR A paper copy in the mail If you have any lab test that is abnormal or we need to change your treatment, we will call you to review the results.   Testing/Procedures: None   Follow-Up: At [facility], you and your health needs are our priority.  As part of our continuing mission to provide you with exceptional heart care, we have created designated Provider Care Teams.  These Care Teams include your primary Cardiologist (physician) and Advanced Practice Providers (APPs -  Physician Assistants and Nurse Practitioners) who all work together to provide you with the care you need, when you need it.  We recommend signing up for the patient portal called "MyChart".  Sign up information is provided on this After Visit Summary.  MyChart is  used to connect with patients for Virtual Visits (Telemedicine).  Patients are able to view lab/test results, encounter notes, upcoming appointments, etc.  Non-urgent messages can be sent to your provider as well.   To learn more about what you can do with MyChart, go to [email or web address].    Your next appointment:   1 year(s)  Provider:   Buford Dresser, MD    Other Instructions If you don't have your cholesterol checked with Dr. Sabra Heck, let me know and we will order it.    Medication Adjustments/Labs and Tests Ordered: Current medicines are reviewed at length with the patient today.  Concerns regarding medicines are outlined above.   Tests Ordered: Orders Placed This Encounter  Procedures   EKG 12-Lead   Medication Changes: No orders of the defined types were placed in this encounter.  I,Coren O'Brien,acting as a Education administrator for [employer], MD.,have documented all relevant documentation on the behalf of Buford Dresser, MD,as directed by  Buford Dresser, MD while in the presence of Buford Dresser, MD.  I, Buford Dresser, MD, have reviewed all documentation for this visit. The documentation on 03/25/22 for the exam, diagnosis, procedures, and orders are all accurate and complete.

## 2022-05-26 DIAGNOSIS — M5136 Other intervertebral disc degeneration, lumbar region: Secondary | ICD-10-CM | POA: Diagnosis not present

## 2022-06-10 DIAGNOSIS — M545 Low back pain, unspecified: Secondary | ICD-10-CM | POA: Diagnosis not present

## 2022-07-13 ENCOUNTER — Other Ambulatory Visit (HOSPITAL_BASED_OUTPATIENT_CLINIC_OR_DEPARTMENT_OTHER): Payer: Self-pay | Admitting: Cardiology

## 2022-07-13 NOTE — Telephone Encounter (Signed)
Rx request sent to pharmacy.  

## 2022-07-16 DIAGNOSIS — Z6825 Body mass index (BMI) 25.0-25.9, adult: Secondary | ICD-10-CM | POA: Diagnosis not present

## 2022-07-16 DIAGNOSIS — Z0000 Encounter for general adult medical examination without abnormal findings: Secondary | ICD-10-CM | POA: Diagnosis not present

## 2022-07-16 DIAGNOSIS — E7800 Pure hypercholesterolemia, unspecified: Secondary | ICD-10-CM | POA: Diagnosis not present

## 2022-07-16 DIAGNOSIS — E039 Hypothyroidism, unspecified: Secondary | ICD-10-CM | POA: Diagnosis not present

## 2022-07-16 DIAGNOSIS — R140 Abdominal distension (gaseous): Secondary | ICD-10-CM | POA: Diagnosis not present

## 2022-07-16 DIAGNOSIS — Z1159 Encounter for screening for other viral diseases: Secondary | ICD-10-CM | POA: Diagnosis not present

## 2022-07-16 DIAGNOSIS — M2550 Pain in unspecified joint: Secondary | ICD-10-CM | POA: Diagnosis not present

## 2022-07-16 DIAGNOSIS — R110 Nausea: Secondary | ICD-10-CM | POA: Diagnosis not present

## 2022-07-16 DIAGNOSIS — I2510 Atherosclerotic heart disease of native coronary artery without angina pectoris: Secondary | ICD-10-CM | POA: Diagnosis not present

## 2022-07-16 LAB — COMPREHENSIVE METABOLIC PANEL: EGFR: 84

## 2022-07-17 LAB — LAB REPORT - SCANNED: HM Hepatitis Screen: NEGATIVE

## 2022-07-21 ENCOUNTER — Other Ambulatory Visit: Payer: Self-pay | Admitting: Family Medicine

## 2022-07-21 DIAGNOSIS — R140 Abdominal distension (gaseous): Secondary | ICD-10-CM

## 2022-08-18 ENCOUNTER — Ambulatory Visit
Admission: RE | Admit: 2022-08-18 | Discharge: 2022-08-18 | Disposition: A | Discharge status: Home / Self Care | Payer: PPO | Source: Ambulatory Visit | Attending: Family Medicine | Admitting: Family Medicine

## 2022-08-18 DIAGNOSIS — R110 Nausea: Secondary | ICD-10-CM | POA: Diagnosis not present

## 2022-08-18 DIAGNOSIS — R140 Abdominal distension (gaseous): Secondary | ICD-10-CM

## 2022-08-18 DIAGNOSIS — I700 Atherosclerosis of aorta: Secondary | ICD-10-CM | POA: Diagnosis not present

## 2022-08-18 MED ORDER — IOPAMIDOL (ISOVUE-300) INJECTION 61%
100.0000 mL | Freq: Once | INTRAVENOUS | Status: AC | PRN
  Administered 2022-08-18: 100 mL via INTRAVENOUS

## 2022-10-20 DIAGNOSIS — Z124 Encounter for screening for malignant neoplasm of cervix: Secondary | ICD-10-CM | POA: Diagnosis not present

## 2022-10-20 DIAGNOSIS — Z1151 Encounter for screening for human papillomavirus (HPV): Secondary | ICD-10-CM | POA: Diagnosis not present

## 2022-10-20 DIAGNOSIS — Z6826 Body mass index (BMI) 26.0-26.9, adult: Secondary | ICD-10-CM | POA: Diagnosis not present

## 2022-10-20 DIAGNOSIS — Z1231 Encounter for screening mammogram for malignant neoplasm of breast: Secondary | ICD-10-CM | POA: Diagnosis not present

## 2023-01-10 DIAGNOSIS — R319 Hematuria, unspecified: Secondary | ICD-10-CM | POA: Diagnosis not present

## 2023-02-23 ENCOUNTER — Other Ambulatory Visit: Payer: Self-pay | Admitting: Endocrinology

## 2023-02-23 DIAGNOSIS — E049 Nontoxic goiter, unspecified: Secondary | ICD-10-CM

## 2023-02-25 ENCOUNTER — Ambulatory Visit
Admission: RE | Admit: 2023-02-25 | Discharge: 2023-02-25 | Disposition: A | Discharge status: Home / Self Care | Payer: PPO | Source: Ambulatory Visit | Attending: Endocrinology | Admitting: Endocrinology

## 2023-02-25 DIAGNOSIS — E049 Nontoxic goiter, unspecified: Secondary | ICD-10-CM

## 2023-03-06 ENCOUNTER — Other Ambulatory Visit (HOSPITAL_BASED_OUTPATIENT_CLINIC_OR_DEPARTMENT_OTHER): Payer: Self-pay | Admitting: Cardiology

## 2023-03-22 DIAGNOSIS — E039 Hypothyroidism, unspecified: Secondary | ICD-10-CM | POA: Diagnosis not present

## 2023-04-06 ENCOUNTER — Ambulatory Visit (HOSPITAL_BASED_OUTPATIENT_CLINIC_OR_DEPARTMENT_OTHER): Payer: PPO | Admitting: Cardiology

## 2023-04-06 ENCOUNTER — Encounter (HOSPITAL_BASED_OUTPATIENT_CLINIC_OR_DEPARTMENT_OTHER): Payer: Self-pay | Admitting: Cardiology

## 2023-04-06 VITALS — BP 112/74 | HR 67 | Ht 61.0 in | Wt 138.3 lb

## 2023-04-06 DIAGNOSIS — Z7189 Other specified counseling: Secondary | ICD-10-CM

## 2023-04-06 DIAGNOSIS — E7800 Pure hypercholesterolemia, unspecified: Secondary | ICD-10-CM

## 2023-04-06 DIAGNOSIS — Z8249 Family history of ischemic heart disease and other diseases of the circulatory system: Secondary | ICD-10-CM

## 2023-04-06 DIAGNOSIS — I2510 Atherosclerotic heart disease of native coronary artery without angina pectoris: Secondary | ICD-10-CM | POA: Diagnosis not present

## 2023-04-06 DIAGNOSIS — R0789 Other chest pain: Secondary | ICD-10-CM | POA: Diagnosis not present

## 2023-04-06 NOTE — Progress Notes (Signed)
Cardiology Office Note:  .   Date:  04/06/2023  ID:  Monica Camacho, DOB [birth year], MRN [id number] PCP: Sigmund Hazel, MD  [facility] Providers Cardiologist:  Jodelle Red, MD {  History of Present Illness: .   Monica Camacho is a 73 y.o. female with nonobstructive CAD and hypercholesterolemia who presents for follow up today. I intially met her 10/26/17 for evaluation of chest pain with a family history of CAD.   Pertinent CV history: CT coronary 12/2017 with Ca score 153, prox LAD with 50-69% stenosis, D1 with 50-69% stenosis. CT FFR negative.  Today: Her chronic chest pain is unchanged. Feels like a bubble in her chest, once felt it like a cramp in her arm that she couldn't move. Happens a few times/week. Was also having stomach issues, had some testing done, changed her diet and this improved. Her chest pain is not related to exertion.   ROS positive for issues of hot flashes/heat sensitivity.   ROS: Denies shortness of breath at rest or with normal exertion. No PND, orthopnea, LE edema or unexpected weight gain. No syncope or palpitations. ROS otherwise negative except as noted.   Studies Reviewed: Marland Kitchen    EKG:  EKG Interpretation Date/Time:  Wednesday April 06 2023 10:32:59 EST Ventricular Rate:  57 PR Interval:  210 QRS Duration:  72 QT Interval:  410 QTC Calculation: 399 R Axis:   69  Text Interpretation: Sinus bradycardia with 1st degree A-V block Confirmed by Jodelle Red [id number]) on 04/06/2023 11:11:25 AM    Physical Exam:   VS:  BP 112/74   Pulse 67   Ht 5\' 1"  (1.549 m)   Wt 138 lb 4.8 oz (62.7 kg)   SpO2 98%   BMI 26.13 kg/m    Wt Readings from Last 3 Encounters:  04/06/23 138 lb 4.8 oz (62.7 kg)  03/25/22 134 lb (60.8 kg)  02/16/21 137 lb 4.8 oz (62.3 kg)    GEN: Well nourished, well developed in no acute distress HEENT: Normal, moist mucous membranes NECK: No JVD CARDIAC: regular rhythm, normal S1 and S2, no rubs or gallops. No  murmur. VASCULAR: Radial and DP pulses 2+ bilaterally. No carotid bruits RESPIRATORY:  Clear to auscultation without rales, wheezing or rhonchi  ABDOMEN: Soft, non-tender, non-distended MUSCULOSKELETAL:  Ambulates independently SKIN: Warm and dry, no edema NEUROLOGIC:  Alert and oriented x 3. No focal neuro deficits noted. PSYCHIATRIC:  Normal affect    ASSESSMENT AND PLAN: .    Atypical chest pain: -we have reviewed her workup at length, discussed that her symptoms are likely noncardiac in nature. Reviewed possible etiologies of this today   Nonobstructive CAD, found on CT cardiac Hypercholesterolemia Family history of heart disease -LDL goal <70, last 70 [facility] -continue rosuvastatin -we have discussed aspirin. -instructed on red flag warning signs, when to seek immediate medical attention.  CV risk counseling and prevention -recommend heart healthy/Mediterranean diet, with whole grains, fruits, vegetable, fish, lean meats, nuts, and olive oil. Limit salt. -recommend moderate walking, 3-5 times/week for 30-50 minutes each session. Aim for at least 150 minutes.week. Goal should be pace of 3 miles/hours, or walking 1.5 miles in 30 minutes -recommend avoidance of tobacco products. Avoid excess alcohol.  Dispo: 1 year or sooner as needed  Signed, Jodelle Red, MD   Jodelle Red, MD, PhD, [facility]  [facility]  [place]  [facility] at [facility] [address], [address] [place], [place]  [address] [phone]   

## 2023-04-06 NOTE — Patient Instructions (Signed)
 Medication Instructions:  Your physician recommends that you continue on your current medications as directed. Please refer to the Current Medication list given to you today.  Follow-Up: At [facility], you and your health needs are our priority.  As part of our continuing mission to provide you with exceptional heart care, we have created designated Provider Care Teams.  These Care Teams include your primary Cardiologist (physician) and Advanced Practice Providers (APPs -  Physician Assistants and Nurse Practitioners) who all work together to provide you with the care you need, when you need it.  We recommend signing up for the patient portal called "MyChart".  Sign up information is provided on this After Visit Summary.  MyChart is used to connect with patients for Virtual Visits (Telemedicine).  Patients are able to view lab/test results, encounter notes, upcoming appointments, etc.  Non-urgent messages can be sent to your provider as well.   To learn more about what you can do with MyChart, go to [email or web address].    Your next appointment:   1 year  Provider:   Jodelle Red, MD

## 2023-05-30 DIAGNOSIS — E039 Hypothyroidism, unspecified: Secondary | ICD-10-CM | POA: Diagnosis not present

## 2023-07-02 DIAGNOSIS — E039 Hypothyroidism, unspecified: Secondary | ICD-10-CM | POA: Diagnosis not present

## 2023-07-02 DIAGNOSIS — E7800 Pure hypercholesterolemia, unspecified: Secondary | ICD-10-CM | POA: Diagnosis not present

## 2023-07-02 DIAGNOSIS — I2510 Atherosclerotic heart disease of native coronary artery without angina pectoris: Secondary | ICD-10-CM | POA: Diagnosis not present

## 2023-09-01 DIAGNOSIS — I2510 Atherosclerotic heart disease of native coronary artery without angina pectoris: Secondary | ICD-10-CM | POA: Diagnosis not present

## 2023-09-01 DIAGNOSIS — E039 Hypothyroidism, unspecified: Secondary | ICD-10-CM | POA: Diagnosis not present

## 2023-09-01 DIAGNOSIS — E7800 Pure hypercholesterolemia, unspecified: Secondary | ICD-10-CM | POA: Diagnosis not present

## 2023-09-10 ENCOUNTER — Other Ambulatory Visit (HOSPITAL_BASED_OUTPATIENT_CLINIC_OR_DEPARTMENT_OTHER): Payer: Self-pay | Admitting: Cardiology

## 2023-10-25 DIAGNOSIS — E7800 Pure hypercholesterolemia, unspecified: Secondary | ICD-10-CM | POA: Diagnosis not present

## 2023-10-25 DIAGNOSIS — E559 Vitamin D deficiency, unspecified: Secondary | ICD-10-CM | POA: Diagnosis not present

## 2023-10-25 DIAGNOSIS — Z23 Encounter for immunization: Secondary | ICD-10-CM | POA: Diagnosis not present

## 2023-10-25 DIAGNOSIS — E039 Hypothyroidism, unspecified: Secondary | ICD-10-CM | POA: Diagnosis not present

## 2023-10-25 DIAGNOSIS — Z6826 Body mass index (BMI) 26.0-26.9, adult: Secondary | ICD-10-CM | POA: Diagnosis not present

## 2023-10-25 DIAGNOSIS — I2510 Atherosclerotic heart disease of native coronary artery without angina pectoris: Secondary | ICD-10-CM | POA: Diagnosis not present

## 2023-10-25 DIAGNOSIS — Z0000 Encounter for general adult medical examination without abnormal findings: Secondary | ICD-10-CM | POA: Diagnosis not present

## 2023-10-25 DIAGNOSIS — I700 Atherosclerosis of aorta: Secondary | ICD-10-CM | POA: Diagnosis not present

## 2023-11-01 DIAGNOSIS — I2510 Atherosclerotic heart disease of native coronary artery without angina pectoris: Secondary | ICD-10-CM | POA: Diagnosis not present

## 2023-11-01 DIAGNOSIS — E7800 Pure hypercholesterolemia, unspecified: Secondary | ICD-10-CM | POA: Diagnosis not present

## 2023-11-01 DIAGNOSIS — E039 Hypothyroidism, unspecified: Secondary | ICD-10-CM | POA: Diagnosis not present

## 2023-11-29 DIAGNOSIS — Z1231 Encounter for screening mammogram for malignant neoplasm of breast: Secondary | ICD-10-CM | POA: Diagnosis not present

## 2023-12-02 DIAGNOSIS — E7800 Pure hypercholesterolemia, unspecified: Secondary | ICD-10-CM | POA: Diagnosis not present

## 2023-12-02 DIAGNOSIS — I2510 Atherosclerotic heart disease of native coronary artery without angina pectoris: Secondary | ICD-10-CM | POA: Diagnosis not present

## 2023-12-02 DIAGNOSIS — E039 Hypothyroidism, unspecified: Secondary | ICD-10-CM | POA: Diagnosis not present
